# Patient Record
Sex: Male | Born: 1950 | Race: White | Hispanic: No | Marital: Married | Smoking: Former smoker
Health system: Southern US, Community
[De-identification: ages and names within clinical notes are randomized; demographics above are authoritative.]

## PROBLEM LIST (undated history)

## (undated) DIAGNOSIS — E119 Type 2 diabetes mellitus without complications: Secondary | ICD-10-CM

## (undated) DIAGNOSIS — E785 Hyperlipidemia, unspecified: Secondary | ICD-10-CM

## (undated) DIAGNOSIS — I517 Cardiomegaly: Secondary | ICD-10-CM

## (undated) DIAGNOSIS — I10 Essential (primary) hypertension: Secondary | ICD-10-CM

## (undated) DIAGNOSIS — I5032 Chronic diastolic (congestive) heart failure: Secondary | ICD-10-CM

## (undated) DIAGNOSIS — I2510 Atherosclerotic heart disease of native coronary artery without angina pectoris: Secondary | ICD-10-CM

## (undated) DIAGNOSIS — I119 Hypertensive heart disease without heart failure: Secondary | ICD-10-CM

## (undated) HISTORY — DX: Hypertensive heart disease without heart failure: I11.9

## (undated) HISTORY — DX: Atherosclerotic heart disease of native coronary artery without angina pectoris: I25.10

## (undated) HISTORY — DX: Hyperlipidemia, unspecified: E78.5

## (undated) HISTORY — DX: Type 2 diabetes mellitus without complications: E11.9

## (undated) HISTORY — DX: Cardiomegaly: I51.7

## (undated) HISTORY — DX: Chronic diastolic (congestive) heart failure: I50.32

---

## 2009-02-05 ENCOUNTER — Inpatient Hospital Stay (HOSPITAL_COMMUNITY): Admission: EM | Admit: 2009-02-05 | Discharge: 2009-02-08 | Payer: Self-pay | Admitting: Interventional Cardiology

## 2009-02-05 ENCOUNTER — Ambulatory Visit: Payer: Self-pay | Admitting: Internal Medicine

## 2009-03-08 ENCOUNTER — Inpatient Hospital Stay (HOSPITAL_COMMUNITY): Admission: AD | Admit: 2009-03-08 | Discharge: 2009-03-09 | Payer: Self-pay | Admitting: Interventional Cardiology

## 2009-03-23 ENCOUNTER — Inpatient Hospital Stay (HOSPITAL_COMMUNITY): Admission: AD | Admit: 2009-03-23 | Discharge: 2009-03-24 | Payer: Self-pay | Admitting: Interventional Cardiology

## 2009-12-06 ENCOUNTER — Ambulatory Visit: Payer: Self-pay | Admitting: Cardiology

## 2010-09-07 LAB — BASIC METABOLIC PANEL
BUN: 10 mg/dL (ref 6–23)
CO2: 28 mEq/L (ref 19–32)
CO2: 24 mEq/L (ref 19–32)
Calcium: 9.1 mg/dL (ref 8.4–10.5)
Chloride: 105 mEq/L (ref 96–112)
Chloride: 104 mEq/L (ref 96–112)
Creatinine, Ser: 0.89 mg/dL (ref 0.4–1.5)
GFR calc Af Amer: >60 mL/min (ref >60)
Sodium: 138 mEq/L (ref 135–145)

## 2010-09-07 LAB — GLUCOSE, CAPILLARY
Glucose-Capillary: 146 mg/dL — ABNORMAL HIGH (ref 70–99)
Glucose-Capillary: 160 mg/dL — ABNORMAL HIGH (ref 70–99)
Glucose-Capillary: 198 mg/dL — ABNORMAL HIGH (ref 70–99)
Glucose-Capillary: 88 mg/dL (ref 70–99)

## 2010-09-07 LAB — CBC
Hemoglobin: 13.9 g/dL (ref 13.0–17.0)
MCHC: 35.4 g/dL (ref 30.0–36.0)
MCHC: 35.3 g/dL (ref 30.0–36.0)
MCV: 91.9 fL (ref 78.0–100.0)
Platelets: 206 10*3/uL (ref 150–400)
RBC: 4.3 MIL/uL (ref 4.22–5.81)
WBC: 5.6 10*3/uL (ref 4.0–10.5)

## 2010-09-08 LAB — BASIC METABOLIC PANEL
CO2: 27 mEq/L (ref 19–32)
Calcium: 8.8 mg/dL (ref 8.4–10.5)
Calcium: 8.9 mg/dL (ref 8.4–10.5)
Creatinine, Ser: 1.04 mg/dL (ref 0.4–1.5)
Creatinine, Ser: 1.13 mg/dL (ref 0.4–1.5)
GFR calc Af Amer: >60 mL/min (ref >60)
GFR calc Af Amer: >60 mL/min (ref >60)
GFR calc non Af Amer: >60 mL/min (ref >60)
GFR calc non Af Amer: >60 mL/min (ref >60)
Glucose, Bld: 191 mg/dL — ABNORMAL HIGH (ref 70–99)
Sodium: 135 mEq/L (ref 135–145)
Sodium: 136 mEq/L (ref 135–145)

## 2010-09-08 LAB — GLUCOSE, CAPILLARY
Glucose-Capillary: 146 mg/dL — ABNORMAL HIGH (ref 70–99)
Glucose-Capillary: 158 mg/dL — ABNORMAL HIGH (ref 70–99)
Glucose-Capillary: 195 mg/dL — ABNORMAL HIGH (ref 70–99)
Glucose-Capillary: 207 mg/dL — ABNORMAL HIGH (ref 70–99)
Glucose-Capillary: 244 mg/dL — ABNORMAL HIGH (ref 70–99)
Glucose-Capillary: 265 mg/dL — ABNORMAL HIGH (ref 70–99)
Glucose-Capillary: 302 mg/dL — ABNORMAL HIGH (ref 70–99)

## 2010-09-08 LAB — CBC
Hemoglobin: 12.7 g/dL — ABNORMAL LOW (ref 13.0–17.0)
Hemoglobin: 13.4 g/dL (ref 13.0–17.0)
MCHC: 35.5 g/dL (ref 30.0–36.0)
MCHC: 35.5 g/dL (ref 30.0–36.0)
MCV: 89.2 fL (ref 78.0–100.0)
Platelets: 195 10*3/uL (ref 150–400)
RBC: 4.2 MIL/uL — ABNORMAL LOW (ref 4.22–5.81)
WBC: 7.2 10*3/uL (ref 4.0–10.5)

## 2010-09-08 LAB — TROPONIN I: Troponin I: <0.01 ng/mL (ref 0.00–0.06)

## 2010-09-08 LAB — LIPID PANEL
Cholesterol: 179 mg/dL (ref 0–200)
LDL Cholesterol: UNDETERMINED mg/dL (ref 0–99)
Triglycerides: 537 mg/dL — ABNORMAL HIGH (ref <150)

## 2010-09-08 LAB — CARDIAC PANEL(CRET KIN+CKTOT+MB+TROPI)
CK, MB: 62.9 ng/mL — ABNORMAL HIGH (ref 0.3–4.0)
Relative Index: 6.7 — ABNORMAL HIGH (ref 0.0–2.5)
Total CK: 610 U/L — ABNORMAL HIGH (ref 7–232)
Troponin I: 41.34 ng/mL (ref 0.00–0.06)

## 2010-09-08 LAB — HEMOGLOBIN A1C
Hgb A1c MFr Bld: 11 % — ABNORMAL HIGH (ref 4.6–6.1)
Mean Plasma Glucose: 269 mg/dL

## 2017-09-01 ENCOUNTER — Other Ambulatory Visit: Payer: Self-pay

## 2017-09-01 ENCOUNTER — Inpatient Hospital Stay (HOSPITAL_COMMUNITY)
Admission: EM | Admit: 2017-09-01 | Discharge: 2017-09-10 | DRG: 233 | Disposition: A | Discharge status: Home / Self Care | Payer: BC Managed Care – PPO | Attending: Cardiothoracic Surgery | Admitting: Cardiothoracic Surgery

## 2017-09-01 ENCOUNTER — Inpatient Hospital Stay: Admit: 2017-09-01 | Payer: Self-pay | Admitting: Cardiology

## 2017-09-01 ENCOUNTER — Emergency Department (HOSPITAL_COMMUNITY): Payer: BC Managed Care – PPO

## 2017-09-01 ENCOUNTER — Encounter (HOSPITAL_COMMUNITY)
Admission: EM | Disposition: A | Discharge status: Home / Self Care | Payer: Self-pay | Source: Home / Self Care | Attending: Cardiothoracic Surgery

## 2017-09-01 ENCOUNTER — Encounter (HOSPITAL_COMMUNITY): Payer: Self-pay | Admitting: *Deleted

## 2017-09-01 DIAGNOSIS — Z7984 Long term (current) use of oral hypoglycemic drugs: Secondary | ICD-10-CM

## 2017-09-01 DIAGNOSIS — E119 Type 2 diabetes mellitus without complications: Secondary | ICD-10-CM | POA: Diagnosis not present

## 2017-09-01 DIAGNOSIS — E1165 Type 2 diabetes mellitus with hyperglycemia: Secondary | ICD-10-CM | POA: Diagnosis present

## 2017-09-01 DIAGNOSIS — Z823 Family history of stroke: Secondary | ICD-10-CM

## 2017-09-01 DIAGNOSIS — I10 Essential (primary) hypertension: Secondary | ICD-10-CM | POA: Diagnosis not present

## 2017-09-01 DIAGNOSIS — Z87891 Personal history of nicotine dependence: Secondary | ICD-10-CM | POA: Diagnosis not present

## 2017-09-01 DIAGNOSIS — I5021 Acute systolic (congestive) heart failure: Secondary | ICD-10-CM | POA: Diagnosis not present

## 2017-09-01 DIAGNOSIS — I110 Hypertensive heart disease with heart failure: Secondary | ICD-10-CM | POA: Diagnosis present

## 2017-09-01 DIAGNOSIS — E785 Hyperlipidemia, unspecified: Secondary | ICD-10-CM | POA: Diagnosis present

## 2017-09-01 DIAGNOSIS — Z7982 Long term (current) use of aspirin: Secondary | ICD-10-CM

## 2017-09-01 DIAGNOSIS — Z951 Presence of aortocoronary bypass graft: Secondary | ICD-10-CM

## 2017-09-01 DIAGNOSIS — I214 Non-ST elevation (NSTEMI) myocardial infarction: Secondary | ICD-10-CM | POA: Diagnosis present

## 2017-09-01 DIAGNOSIS — I2510 Atherosclerotic heart disease of native coronary artery without angina pectoris: Secondary | ICD-10-CM | POA: Diagnosis present

## 2017-09-01 DIAGNOSIS — I252 Old myocardial infarction: Secondary | ICD-10-CM

## 2017-09-01 DIAGNOSIS — D62 Acute posthemorrhagic anemia: Secondary | ICD-10-CM | POA: Diagnosis not present

## 2017-09-01 DIAGNOSIS — Z800 Family history of malignant neoplasm of digestive organs: Secondary | ICD-10-CM | POA: Diagnosis not present

## 2017-09-01 DIAGNOSIS — J9811 Atelectasis: Secondary | ICD-10-CM

## 2017-09-01 DIAGNOSIS — T82855A Stenosis of coronary artery stent, initial encounter: Secondary | ICD-10-CM | POA: Diagnosis present

## 2017-09-01 DIAGNOSIS — Z955 Presence of coronary angioplasty implant and graft: Secondary | ICD-10-CM | POA: Diagnosis not present

## 2017-09-01 DIAGNOSIS — I25110 Atherosclerotic heart disease of native coronary artery with unstable angina pectoris: Secondary | ICD-10-CM

## 2017-09-01 HISTORY — DX: Essential (primary) hypertension: I10

## 2017-09-01 HISTORY — DX: Type 2 diabetes mellitus without complications: E11.9

## 2017-09-01 HISTORY — PX: LEFT HEART CATH AND CORONARY ANGIOGRAPHY: CATH118249

## 2017-09-01 LAB — COMPREHENSIVE METABOLIC PANEL
ALK PHOS: 66 U/L (ref 38–126)
ALT: 31 U/L (ref 17–63)
ANION GAP: 13 (ref 5–15)
AST: 100 U/L — AB (ref 15–41)
Albumin: 3.6 g/dL (ref 3.5–5.0)
BILIRUBIN TOTAL: 1.2 mg/dL (ref 0.3–1.2)
BUN: 15 mg/dL (ref 6–20)
CO2: 20 mmol/L — ABNORMAL LOW (ref 22–32)
Calcium: 8.6 mg/dL — ABNORMAL LOW (ref 8.9–10.3)
Chloride: 104 mmol/L (ref 101–111)
Creatinine, Ser: 0.96 mg/dL (ref 0.61–1.24)
GFR calc Af Amer: >60 mL/min (ref >60)
Glucose, Bld: 156 mg/dL — ABNORMAL HIGH (ref 65–99)
POTASSIUM: 3.9 mmol/L (ref 3.5–5.1)
Sodium: 137 mmol/L (ref 135–145)
TOTAL PROTEIN: 6.9 g/dL (ref 6.5–8.1)

## 2017-09-01 LAB — CBC WITH DIFFERENTIAL/PLATELET
Basophils Absolute: 0.1 10*3/uL (ref 0.0–0.1)
Basophils Relative: 1 %
Eosinophils Absolute: 0.1 10*3/uL (ref 0.0–0.7)
Eosinophils Relative: 1 %
HEMATOCRIT: 46.8 % (ref 39.0–52.0)
HEMOGLOBIN: 16.1 g/dL (ref 13.0–17.0)
LYMPHS PCT: 16 %
Lymphs Abs: 1.9 10*3/uL (ref 0.7–4.0)
MCH: 31.2 pg (ref 26.0–34.0)
MCHC: 34.4 g/dL (ref 30.0–36.0)
MCV: 90.7 fL (ref 78.0–100.0)
MONO ABS: 1.3 10*3/uL — AB (ref 0.1–1.0)
Monocytes Relative: 11 %
NEUTROS ABS: 8.9 10*3/uL — AB (ref 1.7–7.7)
Neutrophils Relative %: 71 %
Platelets: 236 10*3/uL (ref 150–400)
RBC: 5.16 MIL/uL (ref 4.22–5.81)
RDW: 13 % (ref 11.5–15.5)
WBC: 12.3 10*3/uL — ABNORMAL HIGH (ref 4.0–10.5)

## 2017-09-01 LAB — GLUCOSE, CAPILLARY: GLUCOSE-CAPILLARY: 187 mg/dL — AB (ref 65–99)

## 2017-09-01 LAB — TROPONIN I
TROPONIN I: 17.67 ng/mL — AB (ref <0.03)
TROPONIN I: 17.12 ng/mL — AB (ref <0.03)

## 2017-09-01 LAB — I-STAT TROPONIN, ED: TROPONIN I, POC: 20.58 ng/mL — AB (ref 0.00–0.08)

## 2017-09-01 LAB — BRAIN NATRIURETIC PEPTIDE: B NATRIURETIC PEPTIDE 5: 965.6 pg/mL — AB (ref 0.0–100.0)

## 2017-09-01 LAB — MRSA PCR SCREENING: MRSA BY PCR: NEGATIVE

## 2017-09-01 SURGERY — LEFT HEART CATH AND CORONARY ANGIOGRAPHY
Anesthesia: LOCAL

## 2017-09-01 MED ORDER — FUROSEMIDE 10 MG/ML IJ SOLN: 20.0000 mg | Freq: Every day | INTRAMUSCULAR | Status: DC

## 2017-09-01 MED ORDER — HEPARIN BOLUS VIA INFUSION
4000.0000 [IU] | Freq: Once | INTRAVENOUS | Status: AC
  Administered 2017-09-01: 4000 [IU] via INTRAVENOUS
  Filled 2017-09-01: qty 4000

## 2017-09-01 MED ORDER — HEPARIN (PORCINE) IN NACL 2-0.9 UNIT/ML-% IJ SOLN
INTRAMUSCULAR | Status: AC
  Filled 2017-09-01: qty 500

## 2017-09-01 MED ORDER — IOPAMIDOL (ISOVUE-370) INJECTION 76%
INTRAVENOUS | Status: AC
  Filled 2017-09-01: qty 125

## 2017-09-01 MED ORDER — ACETAMINOPHEN 325 MG PO TABS: 650.0000 mg | ORAL_TABLET | ORAL | Status: DC | PRN

## 2017-09-01 MED ORDER — MORPHINE SULFATE (PF) 2 MG/ML IV SOLN: 2.0000 mg | INTRAVENOUS | Status: DC | PRN

## 2017-09-01 MED ORDER — INSULIN ASPART 100 UNIT/ML ~~LOC~~ SOLN
0.0000 [IU] | Freq: Three times a day (TID) | SUBCUTANEOUS | Status: DC
  Administered 2017-09-02: 4 [IU] via SUBCUTANEOUS
  Administered 2017-09-02 – 2017-09-03 (×2): 7 [IU] via SUBCUTANEOUS
  Administered 2017-09-03 – 2017-09-04 (×2): 4 [IU] via SUBCUTANEOUS
  Administered 2017-09-04: 11 [IU] via SUBCUTANEOUS

## 2017-09-01 MED ORDER — MIDAZOLAM HCL 2 MG/2ML IJ SOLN
INTRAMUSCULAR | Status: AC
  Filled 2017-09-01: qty 2

## 2017-09-01 MED ORDER — NITROGLYCERIN 0.4 MG SL SUBL: 0.4000 mg | SUBLINGUAL_TABLET | SUBLINGUAL | Status: DC | PRN

## 2017-09-01 MED ORDER — SODIUM CHLORIDE 0.9% FLUSH
3.0000 mL | Freq: Two times a day (BID) | INTRAVENOUS | Status: DC
  Administered 2017-09-01 – 2017-09-04 (×7): 3 mL via INTRAVENOUS

## 2017-09-01 MED ORDER — HEPARIN SODIUM (PORCINE) 1000 UNIT/ML IJ SOLN
INTRAMUSCULAR | Status: AC
  Filled 2017-09-01: qty 1

## 2017-09-01 MED ORDER — MIDAZOLAM HCL 2 MG/2ML IJ SOLN
INTRAMUSCULAR | Status: DC | PRN
  Administered 2017-09-01: 2 mg via INTRAVENOUS

## 2017-09-01 MED ORDER — ASPIRIN 81 MG PO CHEW
81.0000 mg | CHEWABLE_TABLET | Freq: Every day | ORAL | Status: DC
  Administered 2017-09-01 – 2017-09-04 (×4): 81 mg via ORAL
  Filled 2017-09-01 (×4): qty 1

## 2017-09-01 MED ORDER — SODIUM CHLORIDE 0.9% FLUSH: 3.0000 mL | INTRAVENOUS | Status: DC | PRN

## 2017-09-01 MED ORDER — FENTANYL CITRATE (PF) 100 MCG/2ML IJ SOLN
INTRAMUSCULAR | Status: AC
  Filled 2017-09-01: qty 2

## 2017-09-01 MED ORDER — IOPAMIDOL (ISOVUE-370) INJECTION 76%
INTRAVENOUS | Status: DC | PRN
  Administered 2017-09-01: 90 mL via INTRA_ARTERIAL

## 2017-09-01 MED ORDER — ATORVASTATIN CALCIUM 80 MG PO TABS
80.0000 mg | ORAL_TABLET | Freq: Every day | ORAL | Status: DC
  Administered 2017-09-01 – 2017-09-09 (×8): 80 mg via ORAL
  Filled 2017-09-01 (×8): qty 1

## 2017-09-01 MED ORDER — METOPROLOL TARTRATE 12.5 MG HALF TABLET
12.5000 mg | ORAL_TABLET | Freq: Two times a day (BID) | ORAL | Status: DC
  Administered 2017-09-01 – 2017-09-04 (×7): 12.5 mg via ORAL
  Filled 2017-09-01 (×8): qty 1

## 2017-09-01 MED ORDER — INSULIN DETEMIR 100 UNIT/ML ~~LOC~~ SOLN
10.0000 [IU] | Freq: Every day | SUBCUTANEOUS | Status: DC
  Administered 2017-09-02 – 2017-09-04 (×3): 10 [IU] via SUBCUTANEOUS
  Filled 2017-09-01 (×5): qty 0.1

## 2017-09-01 MED ORDER — SODIUM CHLORIDE 0.9 % IV SOLN: 250.0000 mL | INTRAVENOUS | Status: DC | PRN

## 2017-09-01 MED ORDER — LIDOCAINE HCL (PF) 1 % IJ SOLN
INTRAMUSCULAR | Status: AC
  Filled 2017-09-01: qty 30

## 2017-09-01 MED ORDER — HEPARIN SODIUM (PORCINE) 1000 UNIT/ML IJ SOLN
INTRAMUSCULAR | Status: DC | PRN
  Administered 2017-09-01: 4000 [IU] via INTRAVENOUS

## 2017-09-01 MED ORDER — HEPARIN (PORCINE) IN NACL 100-0.45 UNIT/ML-% IJ SOLN
1500.0000 [IU]/h | INTRAMUSCULAR | Status: DC
  Administered 2017-09-01: 950 [IU]/h via INTRAVENOUS
  Administered 2017-09-02: 1500 [IU]/h via INTRAVENOUS
  Filled 2017-09-01 (×2): qty 250

## 2017-09-01 MED ORDER — ONDANSETRON HCL 4 MG/2ML IJ SOLN: 4.0000 mg | Freq: Four times a day (QID) | INTRAMUSCULAR | Status: DC | PRN

## 2017-09-01 MED ORDER — VERAPAMIL HCL 2.5 MG/ML IV SOLN
INTRAVENOUS | Status: AC
  Filled 2017-09-01: qty 2

## 2017-09-01 MED ORDER — HEPARIN (PORCINE) IN NACL 2-0.9 UNIT/ML-% IJ SOLN
INTRAMUSCULAR | Status: DC | PRN
  Administered 2017-09-01: 10 mL via INTRA_ARTERIAL

## 2017-09-01 MED ORDER — HEPARIN (PORCINE) IN NACL 2-0.9 UNIT/ML-% IJ SOLN
INTRAMUSCULAR | Status: AC | PRN
  Administered 2017-09-01 (×2): 500 mL

## 2017-09-01 MED ORDER — FENTANYL CITRATE (PF) 100 MCG/2ML IJ SOLN
INTRAMUSCULAR | Status: DC | PRN
  Administered 2017-09-01: 25 ug via INTRAVENOUS

## 2017-09-01 MED ORDER — HEPARIN (PORCINE) IN NACL 100-0.45 UNIT/ML-% IJ SOLN
950.0000 [IU]/h | INTRAMUSCULAR | Status: DC
  Administered 2017-09-01: 950 [IU]/h via INTRAVENOUS
  Filled 2017-09-01: qty 250

## 2017-09-01 MED ORDER — LIDOCAINE HCL (PF) 1 % IJ SOLN
INTRAMUSCULAR | Status: DC | PRN
  Administered 2017-09-01: 2 mL

## 2017-09-01 SURGICAL SUPPLY — 14 items
BAND ZEPHYR COMPRESS 30 LONG (HEMOSTASIS) ×2 IMPLANT
CATH IMPULSE 5F ANG/FL3.5 (CATHETERS) ×2 IMPLANT
ELECT DEFIB PAD ADLT CADENCE (PAD) ×2 IMPLANT
GUIDEWIRE INQWIRE 1.5J.035X260 (WIRE) ×1 IMPLANT
INQWIRE 1.5J .035X260CM (WIRE) ×2
KIT ENCORE 26 ADVANTAGE (KITS) ×2 IMPLANT
KIT HEART LEFT (KITS) ×2 IMPLANT
KIT HEMO VALVE WATCHDOG (MISCELLANEOUS) ×2 IMPLANT
NEEDLE PERC 21GX4CM (NEEDLE) ×2 IMPLANT
PACK CARDIAC CATHETERIZATION (CUSTOM PROCEDURE TRAY) ×2 IMPLANT
SHEATH RAIN RADIAL 21G 6FR (SHEATH) ×2 IMPLANT
SYR MEDRAD MARK V 150ML (SYRINGE) ×2 IMPLANT
TRANSDUCER W/STOPCOCK (MISCELLANEOUS) ×2 IMPLANT
TUBING CIL FLEX 10 FLL-RA (TUBING) ×2 IMPLANT

## 2017-09-01 NOTE — ED Triage Notes (Signed)
PT transferred from [facility] With DX End Stemi. . Pt denies any CP on arrival to ED via [facility] transport. PT ambulatory to bed from stretcher.

## 2017-09-01 NOTE — Progress Notes (Signed)
ANTICOAGULATION CONSULT NOTE - Initial Consult  Pharmacy Consult for heparin Indication: chest pain/ACS  No Known Allergies  Patient Measurements: Height: 5\' 7"  (170.2 cm) Weight: 175 lb (79.4 kg) IBW/kg (Calculated) : 66.1 Heparin Dosing Weight: 79.4 kg  Vital Signs: Temp: 98.6 F (37 C) (03/31 1035) Temp Source: Oral (03/31 1035) BP: 141/81 (03/31 1245) Pulse Rate: 69 (03/31 1245)  Labs: Recent Labs    09/01/17 1127  HGB 16.1  HCT 46.8  PLT 236  CREATININE 0.96  TROPONINI 17.67*    Estimated Creatinine Clearance: 76.4 mL/min (by C-G formula based on SCr of 0.96 mg/dL).  Medical History: Past Medical History:  Diagnosis Date  . Coronary artery disease    NSTEMI with significant  2 vessell disease ...bare metal stent to mid circumflex and  residual disease  on RCA ( 80%) diffuse. Normal LV function 02/05/09   . Diabetes mellitus without complication (HCC)   . Hypertension    Assessment: 67 yo M presents with CP. Pharmacy consulted to start heparin. No anticoag PTA. Planned for urgent cath. CBC stable.  Goal of Therapy:  Heparin level 0.3-0.7 units/ml Monitor platelets by anticoagulation protocol: Yes   Plan:  Give heparin 4,000 unit bolus  Start heparin gtt at 950 units/hr Monitor daily heparin level, CBC, s/s of bleed   Elgene Coral J 09/01/2017,1:20 PM

## 2017-09-01 NOTE — Progress Notes (Signed)
Stopped in to follow up with patient and family since praying with them this morning.  Family appreciative of support and would appreciate support as the patient goes to surgery this week.    09/01/17 1620  Clinical Encounter Type  Visited With Patient;Patient and family together  Visit Type Follow-up;Spiritual support

## 2017-09-01 NOTE — H&P (Addendum)
Cardiology Admission History and Physical:   Patient ID: Curtis Little; MRN: [id number]; DOB: 08-Sep-1950   Admission date: 09/01/2017  Primary Care Provider: Patient, No Pcp Per Primary Cardiologist: No primary care provider on file.   Chief Complaint:  Shortness of breath, NSTEMI  Patient Profile:   Curtis Little is a 67 y.o. male with a history of hypertension, diabetes, CAD S/P and NSTEMI with BMS to mid circumflex 2010.   History of Present Illness:   Curtis Little presented to [facility] with shortness of breath.  He has a history of CAD status post NSTEMI in 2010 for which he got a bare-metal stent to the mid circumflex by Dr. Eldridge Dace.   Curtis Little tells me that he awoke around midnight gasping for breath with diaphoresis.  He says he may have had some mild chest pressure and this did not radiate.  He tried to get up and walk around and even walked outside without any relief.  This continued until he was seen in the ED and given oxygen and some type of medication.  His symptoms did resolve but they have been returning intermittently and briefly.  Since his MI in 2010 he has not followed with a cardiologist.  He is followed by his primary care provider and is on aspirin 81 mg statin and beta-blocker.  He is a prior smoker having quit 25 years ago.  He does not drink alcohol regularly.  He has no family history significant for CAD.  Has had no recent exertional chest discomfort or dyspnea.  He says he was outside working in his yard all day yesterday without any exertional symptoms.  Troponin was elevated at 0.75.  EKG without acute ischemic changes. D-dimer was not elevated.  BNP was elevated at 2159.  Chest x-ray showed mild cardiomegaly and mild vascular congestion.  Pneumonia is not excluded.  Blood gas showed low PO2 of 60, O2 sat 93.9.  The patient was given aspirin and started on heparin drip.  He was given metoprolol 25 mg.  Since arrival at [facility] the patient is  noted to have elevated troponins of 17.67 and 20.8.  He also has T wave inversions inferiorly and laterally    Significant findings Troponins 0.75, 17.67, 20.58 BNP 2159, 965.6 CXR: Chronic cardiomegaly, CHF K+ 3.9 Serum creatinine 0.96 AST 100 WBCs 12.3, hemoglobin 16.1 EKG: Sinus rhythm with T wave inversions in leads III and aVF, V5 and V6  Past Medical History:  Diagnosis Date  . Coronary artery disease    NSTEMI with significant  2 vessell disease ...bare metal stent to mid circumflex and  residual disease  on RCA ( 80%) diffuse. Normal LV function 02/05/09   . Diabetes mellitus without complication (HCC)   . Hypertension     History reviewed. No pertinent surgical history.   Medications Prior to Admission: Prior to Admission medications   Medication Sig Start Date End Date Taking? Authorizing Provider  aspirin 325 MG tablet Take 325 mg by mouth daily.   Yes [provider]  atenolol (TENORMIN) 50 MG tablet Take 50 mg by mouth daily.   Yes [provider]  atorvastatin (LIPITOR) 40 MG tablet Take 40 mg by mouth daily. 06/21/17  Yes [provider]  lovastatin (MEVACOR) 20 MG tablet Take 20 mg by mouth at bedtime.   Yes [provider]  TRESIBA FLEXTOUCH 200 UNIT/ML SOPN Inject 70 Units into the skin daily. 07/25/17  Yes [provider]  glipiZIDE (GLUCOTROL)  5 MG tablet Take by mouth 2 (two) times daily before a meal.    [provider]     Allergies:   No Known Allergies  Social History:   Social History   Socioeconomic History  . Marital status: Married    Spouse name: Not on file  . Number of children: Not on file  . Years of education: Not on file  . Highest education level: Not on file  Occupational History  . Not on file  Social Needs  . Financial resource strain: Not on file  . Food insecurity:    Worry: Not on file    Inability: Not on file  . Transportation needs:    Medical: Not on file     Non-medical: Not on file  Tobacco Use  . Smoking status: Former Smoker    Last attempt to quit: 09/02/1998    Years since quitting: 19.0  . Smokeless tobacco: Never Used  Substance and Sexual Activity  . Alcohol use: Yes    Alcohol/week: 0.6 oz    Types: 1 Cans of beer per week  . Drug use: Never  . Sexual activity: Not on file  Lifestyle  . Physical activity:    Days per week: Not on file    Minutes per session: Not on file  . Stress: Not on file  Relationships  . Social connections:    Talks on phone: Not on file    Gets together: Not on file    Attends religious service: Not on file    Active member of club or organization: Not on file    Attends meetings of clubs or organizations: Not on file    Relationship status: Not on file  . Intimate partner violence:    Fear of current or ex partner: Not on file    Emotionally abused: Not on file    Physically abused: Not on file    Forced sexual activity: Not on file  Other Topics Concern  . Not on file  Social History Narrative  . Not on file    Family History:   The patient's family history includes Colon cancer in his mother; Hypertension in his daughter; Other in his sister; Stroke in his father.    ROS:  Please see the history of present illness.  All other ROS reviewed and negative.     Physical Exam/Data:   Vitals:   09/01/17 1200 09/01/17 1215 09/01/17 1230 09/01/17 1245  BP: (!) 141/86 130/83 (!) 141/76 (!) 141/81  Pulse: 76 71 71 69  Resp: (!) 23 (!) 22 (!) 21 20  Temp:      TempSrc:      SpO2: 95% 91% 94% 97%  Weight:      Height:       No intake or output data in the 24 hours ending 09/01/17 1351 Filed Weights   09/01/17 1037  Weight: 175 lb (79.4 kg)   Body mass index is 27.41 kg/m.  General:  Well nourished, well developed, in no acute distress HEENT: normal Lymph: no adenopathy Neck: no JVD Endocrine:  No thryomegaly Vascular: No carotid bruits; FA pulses 2+ bilaterally without bruits    Cardiac:  normal S1, S2; RRR; no murmur  Lungs:  clear to auscultation bilaterally, no wheezing, rhonchi or rales  Abd: soft, nontender, no hepatomegaly  Ext: no edema Musculoskeletal:  No deformities, BUE and BLE strength normal and equal Skin: warm and dry  Neuro:  CNs 2-12 intact, no focal abnormalities  noted Psych:  Normal affect    EKG:  The ECG that was done  was personally reviewed and demonstrates sinus rhythm with T wave inversions in leads III, aVR and V5, V6  Relevant CV Studies:  none  Laboratory Data:  Chemistry Recent Labs  Lab 09/01/17 1127  NA 137  K 3.9  CL 104  CO2 20*  GLUCOSE 156*  BUN 15  CREATININE 0.96  CALCIUM 8.6*  GFRNONAA >60  GFRAA >60  ANIONGAP 13    Recent Labs  Lab 09/01/17 1127  PROT 6.9  ALBUMIN 3.6  AST 100*  ALT 31  ALKPHOS 66  BILITOT 1.2   Hematology Recent Labs  Lab 09/01/17 1127  WBC 12.3*  RBC 5.16  HGB 16.1  HCT 46.8  MCV 90.7  MCH 31.2  MCHC 34.4  RDW 13.0  PLT 236   Cardiac Enzymes Recent Labs  Lab 09/01/17 1127  TROPONINI 17.67*    Recent Labs  Lab 09/01/17 1156  TROPIPOC 20.58*    BNP Recent Labs  Lab 09/01/17 1129  BNP 965.6*    DDimer No results for input(s): DDIMER in the last 168 hours.  Radiology/Studies:  Dg Chest 2 View  Result Date: 09/01/2017 CLINICAL DATA:  Shortness of breath EXAM: CHEST - 2 VIEW COMPARISON:  09/01/2017 FINDINGS: Chronic cardiomegaly. There is diffuse interstitial coarsening with Kerley lines and symmetric perihilar airspace type opacity. Posterior costophrenic sulcus opacification is likely small pleural fluid. Stable aortic and hilar contours. IMPRESSION: CHF. Electronically Signed   By: Marnee Spring M.D.   On: 09/01/2017 12:35    Assessment and Plan:   NSTEMI -Hx NSTEMI w/BMS to mid Cx in 2010 -Transferred from [facility] for elevated troponin. -Troponin now up to 20, inferior lateral EKG changes, and continued intermittent mild dyspnea. -Plan  to take patient to the Cath Lab today to evaluate coronary arteries. -We will check lipid profile and hemoglobin A1c for risk stratification  CAD -No cardiology follow-up since his MI in 2010.  Followed by his PCP.  Medication list includes aspirin 81 mg daily, atenolol 50 mg daily, lovastatin  . Hypertension -Home medications include atenolol 50 mg daily  Diabetes type 2 -On Tresiba at home -Sliding scale insulin while in the hospital -We will check hemoglobin A1c for guidance of risk factor modification   Severity of Illness: The appropriate patient status for this patient is INPATIENT. Inpatient status is judged to be reasonable and necessary in order to provide the required intensity of service to ensure the patient's safety. The patient's presenting symptoms, physical exam findings, and initial radiographic and laboratory data in the context of their chronic comorbidities is felt to place them at high risk for further clinical deterioration. Furthermore, it is not anticipated that the patient will be medically stable for discharge from the hospital within 2 midnights of admission. The following factors support the patient status of inpatient.   " The patient's presenting symptoms include shortness of breath. " The worrisome physical exam findings include elevated troponin and EKG changes. " The initial radiographic and laboratory data are worrisome because of . " The chronic co-morbidities include DM, hypertension, CAD.   * I certify that at the point of admission it is my clinical judgment that the patient will require inpatient hospital care spanning beyond 2 midnights from the point of admission due to high intensity of service, high risk for further deterioration and high frequency of surveillance required.*    For questions or updates, please contact  [facility] Please consult [email or web address] for contact info under Cardiology/STEMI.    Signed, Berton Bon, NP  09/01/2017  1:51 PM   Personally seen and examined. Agree with above.  67 year old male with former inferior myocardial infarction with catheterization performed in 2010 by Dr. Eldridge Dace with diabetes, hypertension who woke up early this morning with symptoms mainly of gasping for breath, heart failure like with some mild chest pressure, diaphoresis.  He felt agitated, walked around.  Remote smoker.  Initial troponin was 0.75 with normal d-dimer.  BNP was elevated at 2100.  PO2 was 60 on arterial gas.  I spoke to the ER physician at [facility] rocking him and ordered transportation over to [facility] for further evaluation.  Once here in the emergency department we repeated blood work and his troponin was 20.  T wave inversions were noted inferiorly and laterally on ECG personally viewed.  Chest x-ray performed there showed potential right basilar lung fullness.  Creatinine 0.96.  Hemoglobin 16.1.  Exam: Anxious, no significant JVD appreciated, no bruits, heart regular rate and rhythm without any significant murmurs rubs or gallops, lungs minimal crackles heard at right base.  No edema.  2+ radial pulses.  Assessment and plan:  Non-ST elevation myocardial infarction -Troponin is now 20.  This places him at increased risk.  He still feeling some shortness of breath off and on.  He may very well require IV Lasix.  We will go ahead and perform urgent cardiac catheterization.  Consider right heart catheterization if felt necessary.  Risks and benefits of procedure including stroke heart attack death renal impairment have been described.  He is willing to proceed.  I have spoken with Dr. Eldridge Dace on phone.   Coronary artery disease status post circumflex stent in 2010 - Going to cardiac catheterization now.  Diabetes with hypertension - Medications reviewed.  Atenolol 50 mg a day.  Evaristo Bury. -Hemoglobin A1c pending.  Hyperlipidemia -He states that he is on a cholesterol medication.  We will check lipid  profile.  Donato Schultz, MD

## 2017-09-01 NOTE — ED Provider Notes (Signed)
[facility] EMERGENCY DEPARTMENT Provider Note   CSN: [id number] Arrival date & time: 09/01/17  1014     History   Chief Complaint Chief Complaint  Patient presents with  . Chest Pain    HPI Curtis Little is a 67 y.o. male.  HPI  Woke up at midnight, felt short of breath, like couldn't get breath Dyspnea present with laying down or sitting up No chest pain No fevers Cough some 5/10 shortness of breath Hx of CAD, no hx of CHF Denies chest pain but does note some very slight chest tightness. Does not feel quite like other MI.   At OSH, BNP 2000, trop .75, ddimer   .3.  He was given aspirin 325mg , heparin 5000mg  bolus and Dr. Anne Fu was contacted.    Past Medical History:  Diagnosis Date  . Coronary artery disease    NSTEMI with significant  2 vessell disease ...bare metal stent to mid circumflex and  residual disease  on RCA ( 80%) diffuse. Normal LV function 02/05/09   . Diabetes mellitus without complication (HCC)   . Hypertension     There are no active problems to display for this patient.   History reviewed. No pertinent surgical history.      Home Medications    Prior to Admission medications   Medication Sig Start Date End Date Taking? Authorizing Provider  aspirin 325 MG tablet Take 325 mg by mouth daily.   Yes [provider]  atenolol (TENORMIN) 50 MG tablet Take 50 mg by mouth daily.   Yes [provider]  atorvastatin (LIPITOR) 40 MG tablet Take 40 mg by mouth daily. 06/21/17  Yes [provider]  lovastatin (MEVACOR) 20 MG tablet Take 20 mg by mouth at bedtime.   Yes [provider]  TRESIBA FLEXTOUCH 200 UNIT/ML SOPN Inject 70 Units into the skin daily. 07/25/17  Yes [provider]  glipiZIDE (GLUCOTROL) 5 MG tablet Take by mouth 2 (two) times daily before a meal.    [provider]    Family History History reviewed. No pertinent family history.  Social History Social  History   Tobacco Use  . Smoking status: Former Smoker    Last attempt to quit: 09/02/1998    Years since quitting: 19.0  . Smokeless tobacco: Never Used  Substance Use Topics  . Alcohol use: Yes    Alcohol/week: 0.6 oz    Types: 1 Cans of beer per week  . Drug use: Never     Allergies   Patient has no known allergies.   Review of Systems Review of Systems  Constitutional: Positive for fatigue. Negative for fever.  HENT: Negative for sore throat.   Eyes: Negative for visual disturbance.  Respiratory: Positive for chest tightness and shortness of breath.   Cardiovascular: Negative for chest pain.  Gastrointestinal: Negative for abdominal pain, nausea and vomiting.  Genitourinary: Negative for difficulty urinating.  Musculoskeletal: Negative for back pain and neck stiffness.  Skin: Negative for rash.  Neurological: Negative for syncope and headaches.     Physical Exam Updated Vital Signs BP (!) 141/81   Pulse 69   Temp 98.6 F (37 C) (Oral)   Resp 20   Ht 5\' 7"  (1.702 m)   Wt 79.4 kg (175 lb)   SpO2 97%   BMI 27.41 kg/m   Physical Exam  Constitutional: He is oriented to person, place, and time. He appears well-developed and well-nourished. No distress.  HENT:  Head: Normocephalic  and atraumatic.  Eyes: Conjunctivae and EOM are normal.  Neck: Normal range of motion.  Cardiovascular: Normal rate, regular rhythm, normal heart sounds and intact distal pulses. Exam reveals no gallop and no friction rub.  No murmur heard. Pulmonary/Chest: Effort normal and breath sounds normal. No respiratory distress. He has no wheezes. He has no rales.  Abdominal: Soft. He exhibits no distension. There is no tenderness. There is no guarding.  Musculoskeletal: He exhibits no edema.  Neurological: He is alert and oriented to person, place, and time.  Skin: Skin is warm and dry. He is not diaphoretic.  Nursing note and vitals reviewed.    ED Treatments / Results  Labs (all labs  ordered are listed, but only abnormal results are displayed) Labs Reviewed  CBC WITH DIFFERENTIAL/PLATELET - Abnormal; Notable for the following components:      Result Value   WBC 12.3 (*)    Neutro Abs 8.9 (*)    Monocytes Absolute 1.3 (*)    All other components within normal limits  COMPREHENSIVE METABOLIC PANEL - Abnormal; Notable for the following components:   CO2 20 (*)    Glucose, Bld 156 (*)    Calcium 8.6 (*)    AST 100 (*)    All other components within normal limits  TROPONIN I - Abnormal; Notable for the following components:   Troponin I 17.67 (*)    All other components within normal limits  BRAIN NATRIURETIC PEPTIDE - Abnormal; Notable for the following components:   B Natriuretic Peptide 965.6 (*)    All other components within normal limits  I-STAT TROPONIN, ED - Abnormal; Notable for the following components:   Troponin i, poc 20.58 (*)    All other components within normal limits  TROPONIN I  TROPONIN I    EKG EKG Interpretation  Date/Time:  Sunday September 01 2017 10:15:53 EDT Ventricular Rate:  71 PR Interval:    QRS Duration: 106 QT Interval:  423 QTC Calculation: 460 R Axis:   81 Text Interpretation:  Sinus rhythm Borderline right axis deviation Repol abnrm suggests ischemia, lateral leads Repolarization abnormality in lateral and inferior leads is new from prior Confirmed by Alvira MondaySchlossman, Courtenay Creger ([id number]) on 09/01/2017 11:20:38 AM   Radiology Dg Chest 2 View  Result Date: 09/01/2017 CLINICAL DATA:  Shortness of breath EXAM: CHEST - 2 VIEW COMPARISON:  09/01/2017 FINDINGS: Chronic cardiomegaly. There is diffuse interstitial coarsening with Kerley lines and symmetric perihilar airspace type opacity. Posterior costophrenic sulcus opacification is likely small pleural fluid. Stable aortic and hilar contours. IMPRESSION: CHF. Electronically Signed   By: Marnee SpringJonathon  Watts M.D.   On: 09/01/2017 12:35    Procedures .Critical Care Performed by: Alvira MondaySchlossman, Special Ranes,  MD Authorized by: Alvira MondaySchlossman, Teaira Croft, MD   Critical care provider statement:    Critical care time (minutes):  30   Critical care was necessary to treat or prevent imminent or life-threatening deterioration of the following conditions:  Cardiac failure   Critical care was time spent personally by me on the following activities:  Discussions with consultants, examination of patient, review of old charts, re-evaluation of patient's condition, ordering and review of radiographic studies, ordering and review of laboratory studies, pulse oximetry and ordering and performing treatments and interventions   (including critical care time)  Medications Ordered in ED Medications  nitroGLYCERIN (NITROSTAT) SL tablet 0.4 mg (has no administration in time range)     Initial Impression / Assessment and Plan / ED Course  I have reviewed the triage vital  signs and the nursing notes.  Pertinent labs & imaging results that were available during my care of the patient were reviewed by me and considered in my medical decision making (see chart for details).     67 year old male with a history of coronary artery disease, MI, diabetes and hypertension presents with concern for shortness of breath, possible N STEMI from [facility].  EKG does show a lateral and inferior T wave changes in comparison to prior.  History unclear if CHF vs ACS.  Patient received heparin bolus and aspirin with EMS.  He initially reported mild chest tightness, but prior to nurse given the nitroglycerin, reported resolution of his symptoms, no chest tightness or pain.  Repeat troponin completed here showing level of 20.  Additional heparin was ordered.  Dr. Anne Fu of cardiology was consulted and notified. Patient to be admitted with concern for NSTEMI.   Final Clinical Impressions(s) / ED Diagnoses   Final diagnoses:  NSTEMI (non-ST elevated myocardial infarction) [facility])    ED Discharge Orders    None       Alvira Monday,  MD 09/01/17 1311

## 2017-09-01 NOTE — Progress Notes (Signed)
Chaplain responding to NSTEMI.  Patient sitting up in bed.  Spouse, daughter and granddaughter at bedside.  Patient states he is [place] and welcomed prayer.  Prayed for the family who are grateful to have spiritual support.  Will continue to follow as needed.    09/01/17 1338  Clinical Encounter Type  Visited With Patient and family together;Health care provider  Visit Type Initial;Spiritual support;ED  Spiritual Encounters  Spiritual Needs Prayer

## 2017-09-01 NOTE — Consult Note (Addendum)
[address]       [place] [address]             [phone]        Curtis Little [facility] Medical Record #[id number] Date of Birth: 09-01-1950  Referring: No ref. provider found Primary Care: Patient, No Pcp Per Primary Cardiologist:No primary care provider on file.  Chief Complaint:    Chief Complaint  Patient presents with  . Chest Pain    History of Present Illness:       Curtis Little is a 67 year old male with a past medical history of coronary artery disease status post NSTEMI in 2010 where he got a bare-metal stent to the mid circumflex and RCA placed by Dr. Eldridge Dace.  He also has hyperlipidemia, hypertension, type 2 diabetes mellitus recently switched to insulin, and he is a reformed smoker having quit 25 years ago.  On September 01, 2017 he presented to the emergency department with shortness of breath, dyspnea with laying down or sitting up, Diaphoresis, and some very slight chest tightness.  The patient tried to walk around but did not get any relief.  His symptoms did eventually resolve but they have been returning intermittently and briefly.  Initial troponin was elevated at 0.75.  His EKG was without acute ischemic changes.  He ultimately underwent a cardiac catheterization which revealed severe three-vessel disease.   We have been consulted for possible surgical revascularization.  Current Activity/ Functional Status: Patient was independent with mobility/ambulation, transfers, ADL's, IADL's.   Zubrod Score: At the time of surgery this patient's most appropriate activity status/level should be described as: []     0    Normal activity, no symptoms [x]     1    Restricted in physical strenuous activity but ambulatory, able to do out light work []     2    Ambulatory and capable of self care, unable to do work activities, up and about                 more than 50%  Of the time                            []     3    Only limited self care, in bed greater  than 50% of waking hours []     4    Completely disabled, no self care, confined to bed or chair []     5    Moribund  Past Medical History:  Diagnosis Date  . Coronary artery disease    NSTEMI with significant  2 vessell disease ...bare metal stent to mid circumflex and  residual disease  on RCA ( 80%) diffuse. Normal LV function 02/05/09   . Diabetes mellitus without complication (HCC)   . Hypertension     History reviewed. No pertinent surgical history.  Social History   Tobacco Use  Smoking Status Former Smoker  . Last attempt to quit: 09/02/1998  . Years since quitting: 19.0  Smokeless Tobacco Never Used    Social History   Substance and Sexual Activity  Alcohol Use Yes  . Alcohol/week: 0.6 oz  . Types: 1 Cans of beer per week    Social History   Socioeconomic History  . Marital status: Married    Spouse name: Not on file  . Number of children: Not on file  . Years of education: Not on file  . Highest  education level: Not on file  Occupational History  . Not on file  Social Needs  . Financial resource strain: Not on file  . Food insecurity:    Worry: Not on file    Inability: Not on file  . Transportation needs:    Medical: Not on file    Non-medical: Not on file  Tobacco Use  . Smoking status: Former Smoker    Last attempt to quit: 09/02/1998    Years since quitting: 19.0  . Smokeless tobacco: Never Used  Substance and Sexual Activity  . Alcohol use: Yes    Alcohol/week: 0.6 oz    Types: 1 Cans of beer per week  . Drug use: Never  . Sexual activity: Not on file  Lifestyle  . Physical activity:    Days per week: Not on file    Minutes per session: Not on file  . Stress: Not on file  Relationships  . Social connections:    Talks on phone: Not on file    Gets together: Not on file    Attends religious service: Not on file    Active member of club or organization: Not on file    Attends meetings of clubs or organizations: Not on file    Relationship  status: Not on file  . Intimate partner violence:    Fear of current or ex partner: Not on file    Emotionally abused: Not on file    Physically abused: Not on file    Forced sexual activity: Not on file  Other Topics Concern  . Not on file  Social History Narrative  . Not on file    No Known Allergies  Current Facility-Administered Medications  Medication Dose Route Frequency Provider Last Rate Last Dose  . 0.9 %  sodium chloride infusion  250 mL Intravenous PRN Corky Crafts, MD      . acetaminophen (TYLENOL) tablet 650 mg  650 mg Oral Q4H PRN Corky Crafts, MD      . aspirin chewable tablet 81 mg  81 mg Oral Daily Corky Crafts, MD      . atorvastatin (LIPITOR) tablet 80 mg  80 mg Oral q1800 Corky Crafts, MD      . heparin ADULT infusion 100 units/mL (25000 units/245mL sodium chloride 0.45%)  950 Units/hr Intravenous Continuous Corky Crafts, MD      . metoprolol tartrate (LOPRESSOR) tablet 12.5 mg  12.5 mg Oral BID Corky Crafts, MD      . nitroGLYCERIN (NITROSTAT) SL tablet 0.4 mg  0.4 mg Sublingual Q5 min PRN Corky Crafts, MD      . ondansetron [facility]) injection 4 mg  4 mg Intravenous Q6H PRN Corky Crafts, MD      . sodium chloride flush (NS) 0.9 % injection 3 mL  3 mL Intravenous Q12H Lance Muss S, MD      . sodium chloride flush (NS) 0.9 % injection 3 mL  3 mL Intravenous PRN Corky Crafts, MD        Medications Prior to Admission  Medication Sig Dispense Refill Last Dose  . aspirin 325 MG tablet Take 325 mg by mouth daily.   08/31/2017 at Unknown time  . atenolol (TENORMIN) 50 MG tablet Take 50 mg by mouth daily.   08/31/2017 at 0800  . atorvastatin (LIPITOR) 40 MG tablet Take 40 mg by mouth daily.   08/31/2017 at Unknown time  . lovastatin (MEVACOR) 20 MG tablet Take  20 mg by mouth at bedtime.   08/31/2017 at Unknown time  . TRESIBA FLEXTOUCH 200 UNIT/ML SOPN Inject 70 Units into the skin daily.    09/01/2017 at Unknown time  . glipiZIDE (GLUCOTROL) 5 MG tablet Take by mouth 2 (two) times daily before a meal.       Family History  Problem Relation Age of Onset  . Colon cancer Mother   . Stroke Father   . Other Sister        overdose in her 56's  . Hypertension Daughter      Review of Systems:   Review of Systems  Constitutional: Positive for diaphoresis.  HENT: Negative.   Eyes: Negative.   Respiratory: Positive for shortness of breath.   Cardiovascular: Positive for chest pain.  Gastrointestinal: Negative for nausea and vomiting.  Genitourinary: Negative.   Neurological: Negative.    Pertinent items are noted in HPI.        Physical Exam: BP 128/81   Pulse 79   Temp 98.6 F (37 C) (Oral)   Resp 19   Ht 5\' 7"  (1.702 m)   Wt 175 lb (79.4 kg)   SpO2 92%   BMI 27.41 kg/m    General appearance: alert, cooperative and no distress Resp: clear to auscultation bilaterally Cardio: regular rate and rhythm, S1, S2 normal, no murmur, click, rub or gallop GI: soft, non-tender; bowel sounds normal; no masses,  no organomegaly Extremities: extremities normal, atraumatic, no cyanosis or edema Neurologic: Grossly normal  Diagnostic Studies & Laboratory data:     Recent Radiology Findings:   Dg Chest 2 View  Result Date: 09/01/2017 CLINICAL DATA:  Shortness of breath EXAM: CHEST - 2 VIEW COMPARISON:  09/01/2017 FINDINGS: Chronic cardiomegaly. There is diffuse interstitial coarsening with Kerley lines and symmetric perihilar airspace type opacity. Posterior costophrenic sulcus opacification is likely small pleural fluid. Stable aortic and hilar contours. IMPRESSION: CHF. Electronically Signed   By: Marnee Spring M.D.   On: 09/01/2017 12:35     I have independently reviewed the above radiologic studies.  Recent Lab Findings: Lab Results  Component Value Date   WBC 12.3 (H) 09/01/2017   HGB 16.1 09/01/2017   HCT 46.8 09/01/2017   PLT 236 09/01/2017   GLUCOSE 156  (H) 09/01/2017   CHOL  02/06/2009    179        ATP III CLASSIFICATION:  <200     mg/dL   Desirable  366-440  mg/dL   Borderline High  >=347    mg/dL   High          TRIG 425 (H) 02/06/2009   HDL 23 (L) 02/06/2009   LDLCALC  02/06/2009    UNABLE TO CALCULATE IF TRIGLYCERIDE OVER 400 mg/dL        Total Cholesterol/HDL:CHD Risk Coronary Heart Disease Risk Table                     Men   Women  1/2 Average Risk   3.4   3.3  Average Risk       5.0   4.4  2 X Average Risk   9.6   7.1  3 X Average Risk  23.4   11.0        Use the calculated Patient Ratio above and the CHD Risk Table to determine the patient's CHD Risk.        ATP III CLASSIFICATION (LDL):  <100     mg/dL  Optimal  100-129  mg/dL   Near or Above                    Optimal  130-159  mg/dL   Borderline  [id number]  mg/dL   High  >045>190     mg/dL   Very High   ALT 31 40/98/119103/31/2019   AST 100 (H) 09/01/2017   NA 137 09/01/2017   K 3.9 09/01/2017   CL 104 09/01/2017   CREATININE 0.96 09/01/2017   BUN 15 09/01/2017   CO2 20 (L) 09/01/2017   HGBA1C (H) 02/06/2009    11.0 (NOTE) The ADA recommends the following therapeutic goal for glycemic control related to Hgb A1c measurement: Goal of therapy: <6.5 Hgb A1c  Reference: American Diabetes Association: Clinical Practice Recommendations 2010, Diabetes Care, 2010, 33: (Suppl  1).      Assessment / Plan:   Plan for CABG this week with Dr. Donata ClayVan Trigt. The procedure was explained in detail and the patient and family's questions were answered to their satisfaction.      I  spent 40 minutes counseling the patient face to face.  Jari Favreessa Conte, PA-C  09/01/2017 3:30 PM  Patient examined and coronary angiogram images personally reviewed. Non-STEMI with moderate LV dysfunction Patient would benefit from bypass graft to the LAD, circumflex, and RCA. Patient will need to recover from MI and start to resolve increasing troponin level Will check echocardiogram and pre-CABG  Dopplers Chest x-ray appears to be with interstitial edema, patient would benefit from diuresis prior to surgery as well.

## 2017-09-01 NOTE — Progress Notes (Signed)
ANTICOAGULATION CONSULT NOTE - Initial Consult  Pharmacy Consult for heparin Indication: chest pain/ACS  No Known Allergies  Patient Measurements: Height: 5\' 7"  (170.2 cm) Weight: 175 lb (79.4 kg) IBW/kg (Calculated) : 66.1 Heparin Dosing Weight: 79.4 kg  Vital Signs: Temp: 98.6 F (37 C) (03/31 1035) Temp Source: Oral (03/31 1035) BP: 128/81 (03/31 1447) Pulse Rate: 79 (03/31 1447)  Labs: Recent Labs    09/01/17 1127  HGB 16.1  HCT 46.8  PLT 236  CREATININE 0.96  TROPONINI 17.67*    Estimated Creatinine Clearance: 76.4 mL/min (by C-G formula based on SCr of 0.96 mg/dL).  Medical History: Past Medical History:  Diagnosis Date  . Coronary artery disease    NSTEMI with significant  2 vessell disease ...bare metal stent to mid circumflex and  residual disease  on RCA ( 80%) diffuse. Normal LV function 02/05/09   . Diabetes mellitus without complication (HCC)   . Hypertension    Assessment: 67 yo M presents with CP. Pharmacy consulted to start heparin. No anticoagulation PTA. CBC stable.  Patient went for urgent cath.   s/p cath  now---severe 3vessel CAD, plan for CABG consult and to restart heparin drip 6 hr post sheath removal. Sheath  removed at 14:52 per RN. No bleeding or hematoma reported.   Goal of Therapy:  Heparin level 0.3-0.7 units/ml Monitor platelets by anticoagulation protocol: Yes   Plan:  Restart heparin drip at 950 ut/hr at 21:00 tonight F/u 6 hr HL Monitor daily heparin level, CBC, s/s of bleed F/u CABG consult  Curtis Little, RPh Clinical Pharmacist Pager: [phone] 09/01/2017,3:39 PM

## 2017-09-02 ENCOUNTER — Inpatient Hospital Stay (HOSPITAL_COMMUNITY): Payer: BC Managed Care – PPO

## 2017-09-02 ENCOUNTER — Other Ambulatory Visit: Payer: Self-pay | Admitting: *Deleted

## 2017-09-02 ENCOUNTER — Encounter (HOSPITAL_COMMUNITY): Payer: Self-pay | Admitting: Interventional Cardiology

## 2017-09-02 ENCOUNTER — Other Ambulatory Visit: Payer: Self-pay

## 2017-09-02 DIAGNOSIS — I5021 Acute systolic (congestive) heart failure: Secondary | ICD-10-CM

## 2017-09-02 DIAGNOSIS — E785 Hyperlipidemia, unspecified: Secondary | ICD-10-CM | POA: Diagnosis not present

## 2017-09-02 DIAGNOSIS — I2510 Atherosclerotic heart disease of native coronary artery without angina pectoris: Secondary | ICD-10-CM

## 2017-09-02 DIAGNOSIS — I214 Non-ST elevation (NSTEMI) myocardial infarction: Secondary | ICD-10-CM

## 2017-09-02 DIAGNOSIS — I25110 Atherosclerotic heart disease of native coronary artery with unstable angina pectoris: Secondary | ICD-10-CM | POA: Diagnosis not present

## 2017-09-02 DIAGNOSIS — Z01810 Encounter for preprocedural cardiovascular examination: Secondary | ICD-10-CM

## 2017-09-02 LAB — LIPID PANEL
CHOLESTEROL: 167 mg/dL (ref 0–200)
Cholesterol: 167 mg/dL (ref 0–200)
HDL: 32 mg/dL — ABNORMAL LOW (ref >40)
HDL: 34 mg/dL — ABNORMAL LOW (ref >40)
LDL CALC: 100 mg/dL — AB (ref 0–99)
LDL Cholesterol: 105 mg/dL — ABNORMAL HIGH (ref 0–99)
TRIGLYCERIDES: 176 mg/dL — AB (ref <150)
Total CHOL/HDL Ratio: 5.2 RATIO
Total CHOL/HDL Ratio: 4.9 RATIO
Triglycerides: 142 mg/dL (ref <150)
VLDL: 35 mg/dL (ref 0–40)
VLDL: 28 mg/dL (ref 0–40)

## 2017-09-02 LAB — PULMONARY FUNCTION TEST
FEF 25-75 Post: 1.3 L/sec
FEF 25-75 Pre: 1.6 L/sec
FEF2575-%Change-Post: -18 %
FEF2575-%Pred-Post: 54 %
FEF2575-%Pred-Pre: 66 %
FEV1-%Change-Post: -3 %
FEV1-%Pred-Post: 54 %
FEV1-%Pred-Pre: 56 %
FEV1-Post: 1.65 L
FEV1-Pre: 1.71 L
FEV1FVC-%Change-Post: 7 %
FEV1FVC-%Pred-Pre: 105 %
FEV6-%Change-Post: -10 %
FEV6-%Pred-Post: 51 %
FEV6-%Pred-Pre: 56 %
FEV6-Post: 1.97 L
FEV6-Pre: 2.19 L
FEV6FVC-%Pred-Post: 105 %
FEV6FVC-%Pred-Pre: 105 %
FVC-%Change-Post: -10 %
FVC-%Pred-Post: 48 %
FVC-%Pred-Pre: 53 %
FVC-Post: 1.97 L
FVC-Pre: 2.19 L
Post FEV1/FVC ratio: 84 %
Post FEV6/FVC ratio: 100 %
Pre FEV1/FVC ratio: 78 %
Pre FEV6/FVC Ratio: 100 %

## 2017-09-02 LAB — COMPREHENSIVE METABOLIC PANEL
ALT: 27 U/L (ref 17–63)
AST: 68 U/L — ABNORMAL HIGH (ref 15–41)
Albumin: 3.2 g/dL — ABNORMAL LOW (ref 3.5–5.0)
Alkaline Phosphatase: 68 U/L (ref 38–126)
Anion gap: 9 (ref 5–15)
BUN: 14 mg/dL (ref 6–20)
CO2: 22 mmol/L (ref 22–32)
Calcium: 8.6 mg/dL — ABNORMAL LOW (ref 8.9–10.3)
Chloride: 107 mmol/L (ref 101–111)
Creatinine, Ser: 0.79 mg/dL (ref 0.61–1.24)
GFR calc Af Amer: >60 mL/min (ref >60)
GFR calc non Af Amer: >60 mL/min (ref >60)
Glucose, Bld: 107 mg/dL — ABNORMAL HIGH (ref 65–99)
Potassium: 3.7 mmol/L (ref 3.5–5.1)
Sodium: 138 mmol/L (ref 135–145)
Total Bilirubin: 1.1 mg/dL (ref 0.3–1.2)
Total Protein: 6.1 g/dL — ABNORMAL LOW (ref 6.5–8.1)

## 2017-09-02 LAB — ECHOCARDIOGRAM COMPLETE
Height: 67 in
Weight: 2800 oz

## 2017-09-02 LAB — BASIC METABOLIC PANEL
ANION GAP: 11 (ref 5–15)
BUN: 16 mg/dL (ref 6–20)
CO2: 21 mmol/L — ABNORMAL LOW (ref 22–32)
Calcium: 8.5 mg/dL — ABNORMAL LOW (ref 8.9–10.3)
Chloride: 105 mmol/L (ref 101–111)
Creatinine, Ser: 0.82 mg/dL (ref 0.61–1.24)
GLUCOSE: 123 mg/dL — AB (ref 65–99)
POTASSIUM: 3.5 mmol/L (ref 3.5–5.1)
Sodium: 137 mmol/L (ref 135–145)

## 2017-09-02 LAB — HEPARIN LEVEL (UNFRACTIONATED)
HEPARIN UNFRACTIONATED: 0.2 [IU]/mL — AB (ref 0.30–0.70)
Heparin Unfractionated: 0.11 IU/mL — ABNORMAL LOW (ref 0.30–0.70)

## 2017-09-02 LAB — CBC
HCT: 44.1 % (ref 39.0–52.0)
Hemoglobin: 14.9 g/dL (ref 13.0–17.0)
MCH: 31 pg (ref 26.0–34.0)
MCHC: 33.8 g/dL (ref 30.0–36.0)
MCV: 91.9 fL (ref 78.0–100.0)
PLATELETS: 233 10*3/uL (ref 150–400)
RBC: 4.8 MIL/uL (ref 4.22–5.81)
RDW: 13.6 % (ref 11.5–15.5)
WBC: 8.3 10*3/uL (ref 4.0–10.5)

## 2017-09-02 LAB — HEMOGLOBIN A1C
Hgb A1c MFr Bld: 9.1 % — ABNORMAL HIGH (ref 4.8–5.6)
Mean Plasma Glucose: 214.47 mg/dL

## 2017-09-02 LAB — URINALYSIS, ROUTINE W REFLEX MICROSCOPIC
Bilirubin Urine: NEGATIVE
Glucose, UA: NEGATIVE mg/dL
Hgb urine dipstick: NEGATIVE
Ketones, ur: NEGATIVE mg/dL
Leukocytes, UA: NEGATIVE
Nitrite: NEGATIVE
Protein, ur: NEGATIVE mg/dL
Specific Gravity, Urine: 1.028 (ref 1.005–1.030)
pH: 5 (ref 5.0–8.0)

## 2017-09-02 LAB — BLOOD GAS, ARTERIAL
Acid-Base Excess: 0.4 mmol/L (ref 0.0–2.0)
Bicarbonate: 23.9 mmol/L (ref 20.0–28.0)
Drawn by: 347621
O2 Content: 2 L/min
O2 Saturation: 95.5 %
Patient temperature: 97.8
pCO2 arterial: 34.1 mmHg (ref 32.0–48.0)
pH, Arterial: 7.458 — ABNORMAL HIGH (ref 7.350–7.450)
pO2, Arterial: 75 mmHg — ABNORMAL LOW (ref 83.0–108.0)

## 2017-09-02 LAB — TROPONIN I
TROPONIN I: 13.73 ng/mL — AB (ref <0.03)
Troponin I: 10.1 ng/mL (ref <0.03)
Troponin I: 6.33 ng/mL (ref <0.03)

## 2017-09-02 LAB — GLUCOSE, CAPILLARY
GLUCOSE-CAPILLARY: 100 mg/dL — AB (ref 65–99)
Glucose-Capillary: 73 mg/dL (ref 65–99)
Glucose-Capillary: 182 mg/dL — ABNORMAL HIGH (ref 65–99)

## 2017-09-02 LAB — TSH: TSH: 2.058 u[IU]/mL (ref 0.350–4.500)

## 2017-09-02 LAB — SURGICAL PCR SCREEN
MRSA, PCR: NEGATIVE
Staphylococcus aureus: NEGATIVE

## 2017-09-02 LAB — PROTIME-INR
INR: 1.09
Prothrombin Time: 14 seconds (ref 11.4–15.2)

## 2017-09-02 MED ORDER — HEPARIN (PORCINE) IN NACL 100-0.45 UNIT/ML-% IJ SOLN
1800.0000 [IU]/h | INTRAMUSCULAR | Status: DC
  Administered 2017-09-03 – 2017-09-04 (×4): 1800 [IU]/h via INTRAVENOUS
  Filled 2017-09-02 (×4): qty 250

## 2017-09-02 MED ORDER — ALBUTEROL SULFATE (2.5 MG/3ML) 0.083% IN NEBU
2.5000 mg | INHALATION_SOLUTION | Freq: Once | RESPIRATORY_TRACT | Status: AC
  Administered 2017-09-02: 2.5 mg via RESPIRATORY_TRACT

## 2017-09-02 MED ORDER — FUROSEMIDE 10 MG/ML IJ SOLN
40.0000 mg | Freq: Every day | INTRAMUSCULAR | Status: AC
  Administered 2017-09-02 – 2017-09-04 (×3): 40 mg via INTRAVENOUS
  Filled 2017-09-02 (×3): qty 4

## 2017-09-02 MED ORDER — FUROSEMIDE 10 MG/ML IJ SOLN: 40.0000 mg | Freq: Two times a day (BID) | INTRAMUSCULAR | Status: DC

## 2017-09-02 NOTE — Progress Notes (Signed)
Pre CABG Dopplers completed. Carotid - 1% to 39% ICA stenosis. Palmar arch normal bilaterally. ABI RT 1.14 Lt  [address]ia Jonica Little, RVS 09/02/2017 7:44 PM

## 2017-09-02 NOTE — Progress Notes (Addendum)
CARDIAC REHAB PHASE I   PRE:  Rate/Rhythm: 68 SR  BP:  Sitting: 134/66     SaO2: 100 % RA  MODE:  Ambulation: 1480 ft   POST:  Rate/Rhythm: 82 Sr  BP:  Sitting: 152/79    SaO2: 97% RA  1447-1540 Pt ambulated 1480 ft with steady gait. No complaints. Preop education completed. Booklet left with patient. Encouraged patient to watch preop video with wife who will be here later. Number left with patient. Bedside RN updated.     Nikki Domverett, Samora Jernberg G, RN 09/02/2017 3:38 PM

## 2017-09-02 NOTE — Progress Notes (Signed)
ABG drawn on 2 lpm for pre-op.

## 2017-09-02 NOTE — Progress Notes (Signed)
Pt complaining of shortness of breath without associated chest pain. O2 saturation 95, vitals stable. Pt repositioned in bed, placed on 3L for comfort and HOB raised > 30 degrees. RN will continue to monitor.

## 2017-09-02 NOTE — Progress Notes (Signed)
ANTICOAGULATION CONSULT NOTE  Pharmacy Consult for heparin Indication: chest pain/ACS  No Known Allergies  Patient Measurements: Height: 5\' 7"  (170.2 cm) Weight: 175 lb (79.4 kg) IBW/kg (Calculated) : 66.1 Heparin Dosing Weight: 79.4 kg  Vital Signs: Temp: 97.8 F (36.6 C) (04/01 0341) Temp Source: Oral (04/01 0341) BP: 133/77 (04/01 0200) Pulse Rate: 66 (04/01 0200)  Labs: Recent Labs    09/01/17 1127 09/01/17 1912 09/02/17 0110 09/02/17 0351  HGB 16.1  --   --  14.9  HCT 46.8  --   --  44.1  PLT 236  --   --  233  LABPROT  --   --   --  14.0  INR  --   --   --  1.09  HEPARINUNFRC  --   --   --  <0.10*  CREATININE 0.96  --  0.82  --   TROPONINI 17.67* 17.12* 13.73*  --     Estimated Creatinine Clearance: 89.5 mL/min (by C-G formula based on SCr of 0.82 mg/dL).  Medical History: Past Medical History:  Diagnosis Date  . Coronary artery disease    NSTEMI with significant  2 vessell disease ...bare metal stent to mid circumflex and  residual disease  on RCA ( 80%) diffuse. Normal LV function 02/05/09   . Diabetes mellitus without complication (HCC)   . Hypertension    Assessment: 67 y.o. male with CAD s/p cath for heparin  Goal of Therapy:  Heparin level 0.3-0.7 units/ml Monitor platelets by anticoagulation protocol: Yes   Plan:  Increase Heparin 1200 units/hr Check heparin level in 8 hours.   Geannie RisenGreg Lively Haberman, PharmD, BCPS  09/02/2017,5:23 AM

## 2017-09-02 NOTE — Progress Notes (Signed)
ANTICOAGULATION CONSULT NOTE - Follow Up Consult  Pharmacy Consult for heparin Indication: chest pain/ACS  No Known Allergies  Patient Measurements: Height: 5\' 7"  (170.2 cm) Weight: 175 lb (79.4 kg) IBW/kg (Calculated) : 66.1 Heparin Dosing Weight: 79.4kg  Vital Signs: Temp: 97.7 F (36.5 C) (04/01 1100) Temp Source: Oral (04/01 1100) BP: 139/79 (04/01 1000) Pulse Rate: 71 (04/01 1200)  Labs: Recent Labs    09/01/17 1127 09/01/17 1912 09/02/17 0110 09/02/17 0351 09/02/17 0805 09/02/17 1207  HGB 16.1  --   --  14.9  --   --   HCT 46.8  --   --  44.1  --   --   PLT 236  --   --  233  --   --   LABPROT  --   --   --  14.0  --   --   INR  --   --   --  1.09  --   --   HEPARINUNFRC  --   --   --  <0.10*  --  0.11*  CREATININE 0.96  --  0.82 0.79  --   --   TROPONINI 17.67* 17.12* 13.73*  --  10.10*  --     Estimated Creatinine Clearance: 91.7 mL/min (by C-G formula based on SCr of 0.79 mg/dL).   Assessment: 67 yo male admitted with CAD. S/P cath with 3 vessel disase. Plan for CABG consult once stable. No anticoag PTA. Hgb 14.9, Plt 233. No bleeding reported, Last heparin level subtherapeutic at 0.11 on 1200u/hr.   Goal of Therapy:  Heparin level 0.3-0.7 units/ml Monitor platelets by anticoagulation protocol: Yes   Plan:  Increase heparin infusion to 1500 units/hr Continue to monitor H&H and platelets  6 hour heparin level   Emeline GeneralWhitney Delyla Sandeen, PharmD Candidate 09/02/2017,2:44 PM

## 2017-09-02 NOTE — Progress Notes (Signed)
ANTICOAGULATION CONSULT NOTE  Pharmacy Consult for heparin Indication: chest pain/ACS  No Known Allergies  Patient Measurements: Height: 5\' 7"  (170.2 cm) Weight: 175 lb (79.4 kg) IBW/kg (Calculated) : 66.1 Heparin Dosing Weight: 79.4 kg  Vital Signs: Temp: 97.7 F (36.5 C) (04/01 2114) Temp Source: Oral (04/01 2114) BP: 130/67 (04/01 2000) Pulse Rate: 77 (04/01 2000)  Labs: Recent Labs    09/01/17 1127  09/02/17 0110 09/02/17 0351 09/02/17 0805 09/02/17 1207 09/02/17 1420 09/02/17 2058  HGB 16.1  --   --  14.9  --   --   --   --   HCT 46.8  --   --  44.1  --   --   --   --   PLT 236  --   --  233  --   --   --   --   LABPROT  --   --   --  14.0  --   --   --   --   INR  --   --   --  1.09  --   --   --   --   HEPARINUNFRC  --   --   --  <0.10*  --  0.11*  --  0.20*  CREATININE 0.96  --  0.82 0.79  --   --   --   --   TROPONINI 17.67*   < > 13.73*  --  10.10*  --  6.33*  --    < > = values in this interval not displayed.    Estimated Creatinine Clearance: 91.7 mL/min (by C-G formula based on SCr of 0.79 mg/dL).  Medical History: Past Medical History:  Diagnosis Date  . Coronary artery disease    NSTEMI with significant  2 vessell disease ...bare metal stent to mid circumflex and  residual disease  on RCA ( 80%) diffuse. Normal LV function 02/05/09   . Diabetes mellitus without complication (HCC)   . Hypertension    Assessment: 67 y.o. male with 3 vessel disease s/p cath 3/31 and plan for CABG on April 4th on IV Heparin.   Heparin level is low at 0.20 on 1500 units/hr.  CBC is stable.  No bleeding reported.  No issues per RN.   Goal of Therapy:  Heparin level 0.3-0.7 units/ml Monitor platelets by anticoagulation protocol: Yes   Plan:  Increase Heparin 1800 units/hr Follow-up AM labs.   Link SnufferJessica Teddy Pena, PharmD, BCPS, BCCCP Clinical Pharmacist Clinical phone 09/02/2017 until 11PM [phone]- [phone] After hours, please call #[phone] 09/02/2017,10:17 PM

## 2017-09-02 NOTE — Progress Notes (Signed)
  Echocardiogram 2D Echocardiogram has been performed.  [name], Curtis Little 09/02/2017, 12:01 PM

## 2017-09-02 NOTE — Progress Notes (Signed)
1 Day Post-Op Procedure(s) (LRB): LEFT HEART CATH AND CORONARY ANGIOGRAPHY (N/A) Subjective: Cardiac enzymes improving Patient's breathing improved Echocardiogram images personally reviewed showing anterior hypokinesia but EF only mildly reduced without valvular insufficiency Plan multivessel CABG first availability on our schedule which is am April 4. Continue diuresis, heparin and cardiac rehab until surgery  Objective: Vital signs in last 24 hours: Temp:  [97.3 F (36.3 C)-98.6 F (37 C)] 98.1 F (36.7 C) (04/01 1500) Pulse Rate:  [62-83] 71 (04/01 1200) Cardiac Rhythm: Normal sinus rhythm (04/01 0800) Resp:  [15-29] 27 (04/01 1600) BP: (125-156)/(62-91) 139/79 (04/01 1000) SpO2:  [90 %-98 %] 96 % (04/01 1500)  Hemodynamic parameters for last 24 hours:   Stable Intake/Output from previous day: 03/31 0701 - 04/01 0700 In: 346.5 [P.O.:240; I.V.:106.5] Out: -  Intake/Output this shift: Total I/O In: 389.4 [P.O.:240; I.V.:149.4] Out: 1400 [Urine:1400]    Lab Results: Recent Labs    09/01/17 1127 09/02/17 0351  WBC 12.3* 8.3  HGB 16.1 14.9  HCT 46.8 44.1  PLT 236 233   BMET:  Recent Labs    09/02/17 0110 09/02/17 0351  NA 137 138  K 3.5 3.7  CL 105 107  CO2 21* 22  GLUCOSE 123* 107*  BUN 16 14  CREATININE 0.82 0.79  CALCIUM 8.5* 8.6*    PT/INR:  Recent Labs    09/02/17 0351  LABPROT 14.0  INR 1.09   ABG    Component Value Date/Time   PHART 7.458 (H) 09/02/2017 0440   HCO3 23.9 09/02/2017 0440   O2SAT 95.5 09/02/2017 0440   CBG (last 3)  Recent Labs    09/01/17 2212 09/02/17 0818 09/02/17 1136  GLUCAP 187* 100* 182*    Assessment/Plan: S/P Procedure(s) (LRB): LEFT HEART CATH AND CORONARY ANGIOGRAPHY (N/A) Multivessel CABG April 4 AM   LOS: 1 day    Kathlee Nationseter Van Trigt III 09/02/2017

## 2017-09-02 NOTE — Progress Notes (Signed)
DAILY PROGRESS NOTE   Patient Name: Curtis Little [facility] Date of Encounter: 09/02/2017  Chief Complaint   No chest pain overnight  Patient Profile   Curtis Little is a 67 y.o. male with a history of hypertension, diabetes, CAD S/P and NSTEMI with BMS to mid circumflex 2010. 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  Subjective   No chest pain overnight, but short of breath. Cath showed multivessel CAD with occluded LCx, severe LAD and diagonal disease and 90% proximal RCA stenosis. LVEF was 35-45% by ventriculogram. Echo is pending. LVEDP was mildly elevated at 19 mmHg. No recorded diuresis overnight. CT surgery has seen and recommends CABG this week.  Objective   Vitals:   09/02/17 0341 09/02/17 0400 09/02/17 0500 09/02/17 0700  BP:  138/77 (!) 156/91 (!) 144/75  Pulse:  69 79 70  Resp:  20 (!) 23 17  Temp: 97.8 F (36.6 C) 97.8 F (36.6 C)  (!) 97.3 F (36.3 C)  TempSrc: Oral Oral  Oral  SpO2:  97% 95% 94%  Weight:      Height:        Intake/Output Summary (Last 24 hours) at 09/02/2017 0945 Last data filed at 09/02/2017 0900 Gross per 24 hour  Intake 390.47 ml  Output -  Net 390.47 ml   Filed Weights   09/01/17 1037  Weight: 175 lb (79.4 kg)    Physical Exam   General appearance: alert and no distress Neck: JVD - 3 cm above sternal notch, no carotid bruit and thyroid not enlarged, symmetric, no tenderness/mass/nodules Lungs: diminished breath sounds bibasilar Heart: regular rate and rhythm, S1, S2 normal, no murmur, click, rub or gallop Abdomen: soft, non-tender; bowel sounds normal; no masses,  no organomegaly Extremities: extremities normal, atraumatic, no cyanosis or edema Pulses: 2+ and symmetric Skin: Skin color, texture, turgor normal. No rashes or lesions Neurologic: Grossly normal Psych: Pleasant  Inpatient Medications    Scheduled Meds: . aspirin  81 mg Oral Daily  . atorvastatin  80 mg Oral q1800  . furosemide  20 mg Intravenous  Daily  . insulin aspart  0-20 Units Subcutaneous TID WC  . insulin detemir  10 Units Subcutaneous Daily  . metoprolol tartrate  12.5 mg Oral BID  . sodium chloride flush  3 mL Intravenous Q12H    Continuous Infusions: . sodium chloride    . heparin 1,200 Units/hr (09/02/17 0700)    PRN Meds: sodium chloride, acetaminophen, morphine injection, nitroGLYCERIN, ondansetron (ZOFRAN) IV, sodium chloride flush   Labs   Results for orders placed or performed during the hospital encounter of 09/01/17 (from the past 48 hour(s))  CBC with Differential     Status: Abnormal   Collection Time: 09/01/17 11:27 AM  Result Value Ref Range   WBC 12.3 (H) 4.0 - 10.5 K/uL   RBC 5.16 4.22 - 5.81 MIL/uL   Hemoglobin 16.1 13.0 - 17.0 g/dL   HCT 46.8 39.0 - 52.0 %   MCV 90.7 78.0 - 100.0 fL   MCH 31.2 26.0 - 34.0 pg   MCHC 34.4 30.0 - 36.0 g/dL   RDW 13.0 11.5 - 15.5 %   Platelets 236 150 - 400 K/uL   Neutrophils Relative % 71 %   Neutro Abs 8.9 (H) 1.7 - 7.7 K/uL   Lymphocytes Relative 16 %   Lymphs Abs 1.9 0.7 - 4.0 K/uL   Monocytes Relative 11 %   Monocytes Absolute 1.3 (H) 0.1 - 1.0 K/uL  Eosinophils Relative 1 %   Eosinophils Absolute 0.1 0.0 - 0.7 K/uL   Basophils Relative 1 %   Basophils Absolute 0.1 0.0 - 0.1 K/uL    Comment: Performed at [facility] [address], [place], [place] [address]  Comprehensive metabolic panel     Status: Abnormal   Collection Time: 09/01/17 11:27 AM  Result Value Ref Range   Sodium 137 135 - 145 mmol/L   Potassium 3.9 3.5 - 5.1 mmol/L   Chloride 104 101 - 111 mmol/L   CO2 20 (L) 22 - 32 mmol/L   Glucose, Bld 156 (H) 65 - 99 mg/dL   BUN 15 6 - 20 mg/dL   Creatinine, Ser 0.96 0.61 - 1.24 mg/dL   Calcium 8.6 (L) 8.9 - 10.3 mg/dL   Total Protein 6.9 6.5 - 8.1 g/dL   Albumin 3.6 3.5 - 5.0 g/dL   AST 100 (H) 15 - 41 U/L   ALT 31 17 - 63 U/L   Alkaline Phosphatase 66 38 - 126 U/L   Total Bilirubin 1.2 0.3 - 1.2 mg/dL   GFR calc non Af Amer >60  >60 mL/min   GFR calc Af Amer >60 >60 mL/min    Comment: (NOTE) The eGFR has been calculated using the CKD EPI equation. This calculation has not been validated in all clinical situations. eGFR's persistently <60 mL/min signify possible Chronic Kidney Disease.    Anion gap 13 5 - 15    Comment: Performed at [facility] [address] [place], [place] [address]  Troponin I (q 6hr x 3)     Status: Abnormal   Collection Time: 09/01/17 11:27 AM  Result Value Ref Range   Troponin I 17.67 (HH) <0.03 ng/mL    Comment: REPEATED TO VERIFY CRITICAL RESULT CALLED TO, READ BACK BY AND VERIFIED WITH: Gordan Payment [id number] 09/01/2017 THOMPSON V RN Performed at [facility], [place] [address], [place], [place] [address]   Brain natriuretic peptide     Status: Abnormal   Collection Time: 09/01/17 11:29 AM  Result Value Ref Range   B Natriuretic Peptide 965.6 (H) 0.0 - 100.0 pg/mL    Comment: Performed at [facility] [address], [place], [place] [address]  I-Stat Troponin, ED (not at [facility])     Status: Abnormal   Collection Time: 09/01/17 11:56 AM  Result Value Ref Range   Troponin i, poc 20.58 (HH) 0.00 - 0.08 ng/mL   Comment NOTIFIED PHYSICIAN    Comment 3            Comment: Due to the release kinetics of cTnI, a negative result within the first hours of the onset of symptoms does not rule out myocardial infarction with certainty. If myocardial infarction is still suspected, repeat the test at appropriate intervals.   MRSA PCR Screening     Status: None   Collection Time: 09/01/17  3:15 PM  Result Value Ref Range   MRSA by PCR NEGATIVE NEGATIVE    Comment:        The GeneXpert MRSA Assay (FDA approved for NASAL specimens only), is one component of a comprehensive MRSA colonization surveillance program. It is not intended to diagnose MRSA infection nor to guide or monitor treatment for MRSA infections. Performed at [facility], [place] [address],  [place], [place] [address]   Glucose, capillary     Status: None   Collection Time: 09/01/17  4:49 PM  Result Value  Ref Range   Glucose-Capillary 73 65 - 99 mg/dL  Troponin I (q 6hr x 3)     Status: Abnormal   Collection Time: 09/01/17  7:12 PM  Result Value Ref Range   Troponin I 17.12 (HH) <0.03 ng/mL    Comment: CRITICAL VALUE NOTED.  VALUE IS CONSISTENT WITH PREVIOUSLY REPORTED AND CALLED VALUE. Performed at [facility], [place] [address], [place], [place] [address]   Glucose, capillary     Status: Abnormal   Collection Time: 09/01/17 10:12 PM  Result Value Ref Range   Glucose-Capillary 187 (H) 65 - 99 mg/dL   Comment 1 Notify RN   Troponin I (q 6hr x 3)     Status: Abnormal   Collection Time: 09/02/17  1:10 AM  Result Value Ref Range   Troponin I 13.73 (HH) <0.03 ng/mL    Comment: CRITICAL VALUE NOTED.  VALUE IS CONSISTENT WITH PREVIOUSLY REPORTED AND CALLED VALUE. Performed at [facility], [place] [address] [place] [address] [place], [place] [address]   Lipid panel     Status: Abnormal   Collection Time: 09/02/17  1:10 AM  Result Value Ref Range   Cholesterol 167 0 - 200 mg/dL   Triglycerides 176 (H) <150 mg/dL   HDL 32 (L) >40 mg/dL   Total CHOL/HDL Ratio 5.2 RATIO   VLDL 35 0 - 40 mg/dL   LDL Cholesterol 100 (H) 0 - 99 mg/dL    Comment:        Total Cholesterol/HDL:CHD Risk Coronary Heart Disease Risk Table                     Men   Women  1/2 Average Risk   3.4   3.3  Average Risk       5.0   4.4  2 X Average Risk   9.6   7.1  3 X Average Risk  23.4   11.0        Use the calculated Patient Ratio above and the CHD Risk Table to determine the patient's CHD Risk.        ATP III CLASSIFICATION (LDL):  <100     mg/dL   Optimal  100-129  mg/dL   Near or Above                    Optimal  130-159  mg/dL   Borderline  160-189  mg/dL   High  >190     mg/dL   Very High Performed at [facility] [address] [place], [place] [address]   Basic metabolic panel      Status: Abnormal   Collection Time: 09/02/17  1:10 AM  Result Value Ref Range   Sodium 137 135 - 145 mmol/L   Potassium 3.5 3.5 - 5.1 mmol/L   Chloride 105 101 - 111 mmol/L   CO2 21 (L) 22 - 32 mmol/L   Glucose, Bld 123 (H) 65 - 99 mg/dL   BUN 16 6 - 20 mg/dL   Creatinine, Ser 0.82 0.61 - 1.24 mg/dL   Calcium 8.5 (L) 8.9 - 10.3 mg/dL   GFR calc non Af Amer >60 >60 mL/min   GFR calc Af Amer >60 >60 mL/min    Comment: (NOTE) The eGFR has been calculated using the CKD EPI equation. This calculation has not been validated in all clinical situations. eGFR's persistently <60 mL/min signify possible Chronic Kidney Disease.    Anion gap 11 5 -  15    Comment: Performed at [facility], [place] [address] [place], [place] [address]  Heparin level (unfractionated)     Status: Abnormal   Collection Time: 09/02/17  3:51 AM  Result Value Ref Range   Heparin Unfractionated <0.10 (L) 0.30 - 0.70 IU/mL    Comment:        IF HEPARIN RESULTS ARE BELOW EXPECTED VALUES, AND PATIENT DOSAGE HAS BEEN CONFIRMED, SUGGEST FOLLOW UP TESTING OF ANTITHROMBIN III LEVELS. Performed at [facility], [place] [address], [place], [place] [address]   TSH     Status: None   Collection Time: 09/02/17  3:51 AM  Result Value Ref Range   TSH 2.058 0.350 - 4.500 uIU/mL    Comment: Performed by a 3rd Generation assay with a functional sensitivity of <=0.01 uIU/mL. Performed at [facility], [place] [address], [place], [place] [address]   Protime-INR     Status: None   Collection Time: 09/02/17  3:51 AM  Result Value Ref Range   Prothrombin Time 14.0 11.4 - 15.2 seconds   INR 1.09     Comment: Performed at [facility] [address], [place], [place] [address]  Lipid panel     Status: Abnormal   Collection Time: 09/02/17  3:51 AM  Result Value Ref Range   Cholesterol 167 0 - 200 mg/dL   Triglycerides 142 <150 mg/dL   HDL 34 (L) >40 mg/dL   Total CHOL/HDL Ratio 4.9 RATIO   VLDL 28 0 - 40  mg/dL   LDL Cholesterol 105 (H) 0 - 99 mg/dL    Comment:        Total Cholesterol/HDL:CHD Risk Coronary Heart Disease Risk Table                     Men   Women  1/2 Average Risk   3.4   3.3  Average Risk       5.0   4.4  2 X Average Risk   9.6   7.1  3 X Average Risk  23.4   11.0        Use the calculated Patient Ratio above and the CHD Risk Table to determine the patient's CHD Risk.        ATP III CLASSIFICATION (LDL):  <100     mg/dL   Optimal  100-129  mg/dL   Near or Above                    Optimal  130-159  mg/dL   Borderline  160-189  mg/dL   High  >190     mg/dL   Very High Performed at [facility] [address], [place], [place] [address]   Hemoglobin A1c     Status: Abnormal   Collection Time: 09/02/17  3:51 AM  Result Value Ref Range   Hgb A1c MFr Bld 9.1 (H) 4.8 - 5.6 %    Comment: (NOTE) Pre diabetes:          5.7%-6.4% Diabetes:              >6.4% Glycemic control for   <7.0% adults with diabetes    Mean Plasma Glucose 214.47 mg/dL    Comment: Performed at [facility] [address] [place], [place] [address]  Comprehensive metabolic panel     Status: Abnormal   Collection Time: 09/02/17  3:51 AM  Result Value Ref  Range   Sodium 138 135 - 145 mmol/L   Potassium 3.7 3.5 - 5.1 mmol/L   Chloride 107 101 - 111 mmol/L   CO2 22 22 - 32 mmol/L   Glucose, Bld 107 (H) 65 - 99 mg/dL   BUN 14 6 - 20 mg/dL   Creatinine, Ser 0.79 0.61 - 1.24 mg/dL   Calcium 8.6 (L) 8.9 - 10.3 mg/dL   Total Protein 6.1 (L) 6.5 - 8.1 g/dL   Albumin 3.2 (L) 3.5 - 5.0 g/dL   AST 68 (H) 15 - 41 U/L   ALT 27 17 - 63 U/L   Alkaline Phosphatase 68 38 - 126 U/L   Total Bilirubin 1.1 0.3 - 1.2 mg/dL   GFR calc non Af Amer >60 >60 mL/min   GFR calc Af Amer >60 >60 mL/min    Comment: (NOTE) The eGFR has been calculated using the CKD EPI equation. This calculation has not been validated in all clinical situations. eGFR's persistently <60 mL/min signify possible Chronic  Kidney Disease.    Anion gap 9 5 - 15    Comment: Performed at [facility] [address] [place] [address]  CBC     Status: None   Collection Time: 09/02/17  3:51 AM  Result Value Ref Range   WBC 8.3 4.0 - 10.5 K/uL   RBC 4.80 4.22 - 5.81 MIL/uL   Hemoglobin 14.9 13.0 - 17.0 g/dL   HCT 44.1 39.0 - 52.0 %   MCV 91.9 78.0 - 100.0 fL   MCH 31.0 26.0 - 34.0 pg   MCHC 33.8 30.0 - 36.0 g/dL   RDW 13.6 11.5 - 15.5 %   Platelets 233 150 - 400 K/uL    Comment: Performed at [facility], [place] [address] [place], [place] [address]  Blood gas, arterial     Status: Abnormal   Collection Time: 09/02/17  4:40 AM  Result Value Ref Range   O2 Content 2.0 L/min   pH, Arterial 7.458 (H) 7.350 - 7.450   pCO2 arterial 34.1 32.0 - 48.0 mmHg   pO2, Arterial 75.0 (L) 83.0 - 108.0 mmHg   Bicarbonate 23.9 20.0 - 28.0 mmol/L   Acid-Base Excess 0.4 0.0 - 2.0 mmol/L   O2 Saturation 95.5 %   Patient temperature 97.8    Collection site LEFT RADIAL    Drawn by [id number]    Sample type ARTERIAL DRAW    Allens test (pass/fail) PASS PASS  Troponin I     Status: Abnormal   Collection Time: 09/02/17  8:05 AM  Result Value Ref Range   Troponin I 10.10 (HH) <0.03 ng/mL    Comment: CRITICAL VALUE NOTED.  VALUE IS CONSISTENT WITH PREVIOUSLY REPORTED AND CALLED VALUE. Performed at [facility], [place] [address] [place], [place] [address]   Glucose, capillary     Status: Abnormal   Collection Time: 09/02/17  8:18 AM  Result Value Ref Range   Glucose-Capillary 100 (H) 65 - 99 mg/dL   Comment 1 Capillary Specimen     ECG   N/A  Telemetry   NSR - Personally Reviewed  Radiology    Dg Chest 2 View  Result Date: 09/01/2017 CLINICAL DATA:  Shortness of breath EXAM: CHEST - 2 VIEW COMPARISON:  09/01/2017 FINDINGS: Chronic cardiomegaly. There is diffuse interstitial coarsening with Kerley lines and symmetric perihilar airspace type opacity. Posterior costophrenic sulcus  opacification is likely small pleural fluid. Stable aortic and hilar contours. IMPRESSION:  CHF. Electronically Signed   By: Monte Fantasia M.D.   On: 09/01/2017 12:35    Cardiac Studies   Echo pending  Assessment   Active Problems:   Non-ST elevation (NSTEMI) myocardial infarction (HCC)   Acute systolic CHF (congestive heart failure) (HCC)   CAD, multiple vessel   Plan   1. Multivessel CAD on cath. Appears mildly volume overloaded on exam - dyspneic at times, when laying down. CXR yesterday consistent with CHF. BNP 956. Start diuresis with lasix. On IV heparin. LDL remains elevated at 105 - atorvastatin increased to 80 mg QHS. Plan to optimize for surgery this week.  Time Spent Directly with Patient:  I have spent a total of 35 minutes with the patient reviewing hospital notes, telemetry, EKGs, labs and examining the patient as well as establishing an assessment and plan that was discussed personally with the patient. > 50% of time was spent in direct patient care.  Length of Stay:  LOS: 1 day   Pixie Casino, MD, [facility], [place] Director of the Advanced Lipid Disorders &  Cardiovascular Risk Reduction Clinic Diplomate of the American Board of Clinical Lipidology Attending Cardiologist  Direct Dial: [phone]  Fax: [phone]  Website:  [email or web address] 09/02/2017, 9:45 AM

## 2017-09-03 ENCOUNTER — Inpatient Hospital Stay (HOSPITAL_COMMUNITY): Payer: BC Managed Care – PPO

## 2017-09-03 DIAGNOSIS — I25110 Atherosclerotic heart disease of native coronary artery with unstable angina pectoris: Secondary | ICD-10-CM | POA: Diagnosis not present

## 2017-09-03 DIAGNOSIS — I2510 Atherosclerotic heart disease of native coronary artery without angina pectoris: Secondary | ICD-10-CM | POA: Diagnosis not present

## 2017-09-03 DIAGNOSIS — G4701 Insomnia due to medical condition: Secondary | ICD-10-CM

## 2017-09-03 DIAGNOSIS — I214 Non-ST elevation (NSTEMI) myocardial infarction: Secondary | ICD-10-CM | POA: Diagnosis not present

## 2017-09-03 DIAGNOSIS — I5021 Acute systolic (congestive) heart failure: Secondary | ICD-10-CM | POA: Diagnosis not present

## 2017-09-03 LAB — CBC
HCT: 41.2 % (ref 39.0–52.0)
Hemoglobin: 13.8 g/dL (ref 13.0–17.0)
MCH: 30.8 pg (ref 26.0–34.0)
MCHC: 33.5 g/dL (ref 30.0–36.0)
MCV: 92 fL (ref 78.0–100.0)
PLATELETS: 207 10*3/uL (ref 150–400)
RBC: 4.48 MIL/uL (ref 4.22–5.81)
RDW: 13.3 % (ref 11.5–15.5)
WBC: 8.3 10*3/uL (ref 4.0–10.5)

## 2017-09-03 LAB — GLUCOSE, CAPILLARY
GLUCOSE-CAPILLARY: 191 mg/dL — AB (ref 65–99)
Glucose-Capillary: 102 mg/dL — ABNORMAL HIGH (ref 65–99)
Glucose-Capillary: 200 mg/dL — ABNORMAL HIGH (ref 65–99)
Glucose-Capillary: 219 mg/dL — ABNORMAL HIGH (ref 65–99)
Glucose-Capillary: 236 mg/dL — ABNORMAL HIGH (ref 65–99)
Glucose-Capillary: 94 mg/dL (ref 65–99)

## 2017-09-03 LAB — HEPARIN LEVEL (UNFRACTIONATED): Heparin Unfractionated: 0.6 IU/mL (ref 0.30–0.70)

## 2017-09-03 MED ORDER — ZOLPIDEM TARTRATE 5 MG PO TABS: 5.0000 mg | ORAL_TABLET | Freq: Every evening | ORAL | Status: DC | PRN

## 2017-09-03 MED ORDER — IOPAMIDOL (ISOVUE-300) INJECTION 61%
INTRAVENOUS | Status: AC
  Administered 2017-09-03: 75 mL via INTRAVENOUS
  Filled 2017-09-03: qty 100

## 2017-09-03 NOTE — Progress Notes (Signed)
DAILY PROGRESS NOTE   Patient Name: Curtis Little [facility] Date of Encounter: 09/03/2017  Chief Complaint   Breathing improved overnight  Patient Profile   Curtis Little is a 67 y.o. male with a history of hypertension, diabetes, CAD S/P and NSTEMI with BMS to mid circumflex 2010. 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  Subjective   Breathing improved overnight. Diuresed about 1L negative. BP remains mildly elevated. Weight 180 lbs. Echo yesterday shows low normal LVEF 50-55%.   Objective   Vitals:   09/03/17 0745 09/03/17 0800 09/03/17 0817 09/03/17 1000  BP:  130/68  (!) 151/83  Pulse: 70 64  81  Resp:      Temp:   97.9 F (36.6 C)   TempSrc:   Oral   SpO2: 97% 96%  96%  Weight:      Height:        Intake/Output Summary (Last 24 hours) at 09/03/2017 1028 Last data filed at 09/03/2017 0700 Gross per 24 hour  Intake 691.15 ml  Output 1575 ml  Net -883.85 ml   Filed Weights   09/01/17 1037 09/03/17 0500  Weight: 175 lb (79.4 kg) 180 lb 9.6 oz (81.9 kg)    Physical Exam   General appearance: alert and no distress Neck: JVD - 1 cm above sternal notch, no carotid bruit and thyroid not enlarged, symmetric, no tenderness/mass/nodules Lungs: diminished breath sounds bibasilar Heart: regular rate and rhythm, S1, S2 normal, no murmur, click, rub or gallop Abdomen: soft, non-tender; bowel sounds normal; no masses,  no organomegaly Extremities: extremities normal, atraumatic, no cyanosis or edema Pulses: 2+ and symmetric Skin: Skin color, texture, turgor normal. No rashes or lesions Neurologic: Grossly normal Psych: Pleasant  Inpatient Medications    Scheduled Meds: . aspirin  81 mg Oral Daily  . atorvastatin  80 mg Oral q1800  . furosemide  40 mg Intravenous Daily  . insulin aspart  0-20 Units Subcutaneous TID WC  . insulin detemir  10 Units Subcutaneous Daily  . metoprolol tartrate  12.5 mg Oral BID  . sodium chloride flush  3 mL Intravenous Q12H     Continuous Infusions: . sodium chloride    . heparin 1,800 Units/hr (09/03/17 0823)    PRN Meds: sodium chloride, acetaminophen, morphine injection, nitroGLYCERIN, ondansetron (ZOFRAN) IV, sodium chloride flush   Labs   Results for orders placed or performed during the hospital encounter of 09/01/17 (from the past 48 hour(s))  CBC with Differential     Status: Abnormal   Collection Time: 09/01/17 11:27 AM  Result Value Ref Range   WBC 12.3 (H) 4.0 - 10.5 K/uL   RBC 5.16 4.22 - 5.81 MIL/uL   Hemoglobin 16.1 13.0 - 17.0 g/dL   HCT 46.8 39.0 - 52.0 %   MCV 90.7 78.0 - 100.0 fL   MCH 31.2 26.0 - 34.0 pg   MCHC 34.4 30.0 - 36.0 g/dL   RDW 13.0 11.5 - 15.5 %   Platelets 236 150 - 400 K/uL   Neutrophils Relative % 71 %   Neutro Abs 8.9 (H) 1.7 - 7.7 K/uL   Lymphocytes Relative 16 %   Lymphs Abs 1.9 0.7 - 4.0 K/uL   Monocytes Relative 11 %   Monocytes Absolute 1.3 (H) 0.1 - 1.0 K/uL   Eosinophils Relative 1 %   Eosinophils Absolute 0.1 0.0 - 0.7 K/uL   Basophils Relative 1 %   Basophils Absolute 0.1 0.0 - 0.1 K/uL  Comment: Performed at [facility], [place] [address], [place], [place] [address]  Comprehensive metabolic panel     Status: Abnormal   Collection Time: 09/01/17 11:27 AM  Result Value Ref Range   Sodium 137 135 - 145 mmol/L   Potassium 3.9 3.5 - 5.1 mmol/L   Chloride 104 101 - 111 mmol/L   CO2 20 (L) 22 - 32 mmol/L   Glucose, Bld 156 (H) 65 - 99 mg/dL   BUN 15 6 - 20 mg/dL   Creatinine, Ser 0.96 0.61 - 1.24 mg/dL   Calcium 8.6 (L) 8.9 - 10.3 mg/dL   Total Protein 6.9 6.5 - 8.1 g/dL   Albumin 3.6 3.5 - 5.0 g/dL   AST 100 (H) 15 - 41 U/L   ALT 31 17 - 63 U/L   Alkaline Phosphatase 66 38 - 126 U/L   Total Bilirubin 1.2 0.3 - 1.2 mg/dL   GFR calc non Af Amer >60 >60 mL/min   GFR calc Af Amer >60 >60 mL/min    Comment: (NOTE) The eGFR has been calculated using the CKD EPI equation. This calculation has not been validated in all clinical  situations. eGFR's persistently <60 mL/min signify possible Chronic Kidney Disease.    Anion gap 13 5 - 15    Comment: Performed at [facility] [address] [place], [place] [address]  Troponin I (q 6hr x 3)     Status: Abnormal   Collection Time: 09/01/17 11:27 AM  Result Value Ref Range   Troponin I 17.67 (HH) <0.03 ng/mL    Comment: REPEATED TO VERIFY CRITICAL RESULT CALLED TO, READ BACK BY AND VERIFIED WITH: Gordan Payment 1244 09/01/2017 THOMPSON V RN Performed at [facility], [place] [address] [place], [place] [address]   Brain natriuretic peptide     Status: Abnormal   Collection Time: 09/01/17 11:29 AM  Result Value Ref Range   B Natriuretic Peptide 965.6 (H) 0.0 - 100.0 pg/mL    Comment: Performed at [facility] [address], [place],  [address]  I-Stat Troponin, ED (not at [facility])     Status: Abnormal   Collection Time: 09/01/17 11:56 AM  Result Value Ref Range   Troponin i, poc 20.58 (HH) 0.00 - 0.08 ng/mL   Comment NOTIFIED PHYSICIAN    Comment 3            Comment: Due to the release kinetics of cTnI, a negative result within the first hours of the onset of symptoms does not rule out myocardial infarction with certainty. If myocardial infarction is still suspected, repeat the test at appropriate intervals.   MRSA PCR Screening     Status: None   Collection Time: 09/01/17  3:15 PM  Result Value Ref Range   MRSA by PCR NEGATIVE NEGATIVE    Comment:        The GeneXpert MRSA Assay (FDA approved for NASAL specimens only), is one component of a comprehensive MRSA colonization surveillance program. It is not intended to diagnose MRSA infection nor to guide or monitor treatment for MRSA infections. Performed at [facility], [place] [address] [place], [place] [address]   Glucose, capillary     Status: None   Collection Time: 09/01/17  4:49 PM  Result Value Ref Range   Glucose-Capillary 73 65 - 99 mg/dL  Troponin I (q 6hr x  3)     Status: Abnormal   Collection Time: 09/01/17  7:12 PM  Result Value Ref Range   Troponin I 17.12 (HH) <0.03 ng/mL    Comment: CRITICAL VALUE NOTED.  VALUE IS CONSISTENT WITH PREVIOUSLY REPORTED AND CALLED VALUE. Performed at [facility], [place] [address] [place], [place] [address]   Glucose, capillary     Status: Abnormal   Collection Time: 09/01/17 10:12 PM  Result Value Ref Range   Glucose-Capillary 187 (H) 65 - 99 mg/dL   Comment 1 Notify RN   Troponin I (q 6hr x 3)     Status: Abnormal   Collection Time: 09/02/17  1:10 AM  Result Value Ref Range   Troponin I 13.73 (HH) <0.03 ng/mL    Comment: CRITICAL VALUE NOTED.  VALUE IS CONSISTENT WITH PREVIOUSLY REPORTED AND CALLED VALUE. Performed at [facility], [place] [address], [place], [place] [address]   Lipid panel     Status: Abnormal   Collection Time: 09/02/17  1:10 AM  Result Value Ref Range   Cholesterol 167 0 - 200 mg/dL   Triglycerides 176 (H) <150 mg/dL   HDL 32 (L) >40 mg/dL   Total CHOL/HDL Ratio 5.2 RATIO   VLDL 35 0 - 40 mg/dL   LDL Cholesterol 100 (H) 0 - 99 mg/dL    Comment:        Total Cholesterol/HDL:CHD Risk Coronary Heart Disease Risk Table                     Men   Women  1/2 Average Risk   3.4   3.3  Average Risk       5.0   4.4  2 X Average Risk   9.6   7.1  3 X Average Risk  23.4   11.0        Use the calculated Patient Ratio above and the CHD Risk Table to determine the patient's CHD Risk.        ATP III CLASSIFICATION (LDL):  <100     mg/dL   Optimal  100-129  mg/dL   Near or Above                    Optimal  130-159  mg/dL   Borderline  160-189  mg/dL   High  >190     mg/dL   Very High Performed at [facility] [address], [place], [place] [address]   Basic metabolic panel     Status: Abnormal   Collection Time: 09/02/17  1:10 AM  Result Value Ref Range   Sodium 137 135 - 145 mmol/L   Potassium 3.5 3.5 - 5.1 mmol/L   Chloride 105 101 - 111 mmol/L   CO2 21  (L) 22 - 32 mmol/L   Glucose, Bld 123 (H) 65 - 99 mg/dL   BUN 16 6 - 20 mg/dL   Creatinine, Ser 0.82 0.61 - 1.24 mg/dL   Calcium 8.5 (L) 8.9 - 10.3 mg/dL   GFR calc non Af Amer >60 >60 mL/min   GFR calc Af Amer >60 >60 mL/min    Comment: (NOTE) The eGFR has been calculated using the CKD EPI equation. This calculation has not been validated in all clinical situations. eGFR's persistently <60 mL/min signify possible Chronic Kidney Disease.    Anion gap 11 5 - 15    Comment: Performed at [facility] [address] [place], [place] [address]  Heparin level (unfractionated)     Status: Abnormal   Collection Time:  09/02/17  3:51 AM  Result Value Ref Range   Heparin Unfractionated <0.10 (L) 0.30 - 0.70 IU/mL    Comment:        IF HEPARIN RESULTS ARE BELOW EXPECTED VALUES, AND PATIENT DOSAGE HAS BEEN CONFIRMED, SUGGEST FOLLOW UP TESTING OF ANTITHROMBIN III LEVELS. Performed at [facility], [place] [address], [place], [place] [address]   TSH     Status: None   Collection Time: 09/02/17  3:51 AM  Result Value Ref Range   TSH 2.058 0.350 - 4.500 uIU/mL    Comment: Performed by a 3rd Generation assay with a functional sensitivity of <=0.01 uIU/mL. Performed at [facility], [place] [address] [place], [place] [address]   Protime-INR     Status: None   Collection Time: 09/02/17  3:51 AM  Result Value Ref Range   Prothrombin Time 14.0 11.4 - 15.2 seconds   INR 1.09     Comment: Performed at [facility] [address] [place], [place] [address]  Lipid panel     Status: Abnormal   Collection Time: 09/02/17  3:51 AM  Result Value Ref Range   Cholesterol 167 0 - 200 mg/dL   Triglycerides 142 <150 mg/dL   HDL 34 (L) >40 mg/dL   Total CHOL/HDL Ratio 4.9 RATIO   VLDL 28 0 - 40 mg/dL   LDL Cholesterol 105 (H) 0 - 99 mg/dL    Comment:        Total Cholesterol/HDL:CHD Risk Coronary Heart Disease Risk Table                     Men   Women  1/2 Average Risk   3.4    3.3  Average Risk       5.0   4.4  2 X Average Risk   9.6   7.1  3 X Average Risk  23.4   11.0        Use the calculated Patient Ratio above and the CHD Risk Table to determine the patient's CHD Risk.        ATP III CLASSIFICATION (LDL):  <100     mg/dL   Optimal  100-129  mg/dL   Near or Above                    Optimal  130-159  mg/dL   Borderline  160-189  mg/dL   High  >190     mg/dL   Very High Performed at [facility] [address], [place], [place] [address]   Hemoglobin A1c     Status: Abnormal   Collection Time: 09/02/17  3:51 AM  Result Value Ref Range   Hgb A1c MFr Bld 9.1 (H) 4.8 - 5.6 %    Comment: (NOTE) Pre diabetes:          5.7%-6.4% Diabetes:              >6.4% Glycemic control for   <7.0% adults with diabetes    Mean Plasma Glucose 214.47 mg/dL    Comment: Performed at [facility] [address] [place], [place] [address]  Comprehensive metabolic panel     Status: Abnormal   Collection Time: 09/02/17  3:51 AM  Result Value Ref Range   Sodium 138 135 - 145 mmol/L   Potassium 3.7 3.5 - 5.1 mmol/L   Chloride 107 101 - 111 mmol/L   CO2 22 22 - 32  mmol/L   Glucose, Bld 107 (H) 65 - 99 mg/dL   BUN 14 6 - 20 mg/dL   Creatinine, Ser 0.79 0.61 - 1.24 mg/dL   Calcium 8.6 (L) 8.9 - 10.3 mg/dL   Total Protein 6.1 (L) 6.5 - 8.1 g/dL   Albumin 3.2 (L) 3.5 - 5.0 g/dL   AST 68 (H) 15 - 41 U/L   ALT 27 17 - 63 U/L   Alkaline Phosphatase 68 38 - 126 U/L   Total Bilirubin 1.1 0.3 - 1.2 mg/dL   GFR calc non Af Amer >60 >60 mL/min   GFR calc Af Amer >60 >60 mL/min    Comment: (NOTE) The eGFR has been calculated using the CKD EPI equation. This calculation has not been validated in all clinical situations. eGFR's persistently <60 mL/min signify possible Chronic Kidney Disease.    Anion gap 9 5 - 15    Comment: Performed at [facility] [address] [place] [address]  CBC     Status: None   Collection Time: 09/02/17  3:51 AM   Result Value Ref Range   WBC 8.3 4.0 - 10.5 K/uL   RBC 4.80 4.22 - 5.81 MIL/uL   Hemoglobin 14.9 13.0 - 17.0 g/dL   HCT 44.1 39.0 - 52.0 %   MCV 91.9 78.0 - 100.0 fL   MCH 31.0 26.0 - 34.0 pg   MCHC 33.8 30.0 - 36.0 g/dL   RDW 13.6 11.5 - 15.5 %   Platelets 233 150 - 400 K/uL    Comment: Performed at [facility], [place] [address] [place], [place] [address]  Blood gas, arterial     Status: Abnormal   Collection Time: 09/02/17  4:40 AM  Result Value Ref Range   O2 Content 2.0 L/min   pH, Arterial 7.458 (H) 7.350 - 7.450   pCO2 arterial 34.1 32.0 - 48.0 mmHg   pO2, Arterial 75.0 (L) 83.0 - 108.0 mmHg   Bicarbonate 23.9 20.0 - 28.0 mmol/L   Acid-Base Excess 0.4 0.0 - 2.0 mmol/L   O2 Saturation 95.5 %   Patient temperature 97.8    Collection site LEFT RADIAL    Drawn by [id number]    Sample type ARTERIAL DRAW    Allens test (pass/fail) PASS PASS  Troponin I     Status: Abnormal   Collection Time: 09/02/17  8:05 AM  Result Value Ref Range   Troponin I 10.10 (HH) <0.03 ng/mL    Comment: CRITICAL VALUE NOTED.  VALUE IS CONSISTENT WITH PREVIOUSLY REPORTED AND CALLED VALUE. Performed at [facility], [place] [address], [place], [place] [address]   Surgical pcr screen     Status: None   Collection Time: 09/02/17  8:07 AM  Result Value Ref Range   MRSA, PCR NEGATIVE NEGATIVE   Staphylococcus aureus NEGATIVE NEGATIVE    Comment: (NOTE) The Xpert SA Assay (FDA approved for NASAL specimens in patients 37 years of age and older), is one component of a comprehensive surveillance program. It is not intended to diagnose infection nor to guide or monitor treatment. Performed at [facility], [place] [address], [place],  [address]   Glucose, capillary     Status: Abnormal   Collection Time: 09/02/17  8:18 AM  Result Value Ref Range   Glucose-Capillary 100 (H) 65 - 99 mg/dL   Comment 1 Capillary Specimen   Urinalysis, Routine w reflex microscopic     Status: None  Collection Time: 09/02/17 10:27 AM  Result Value Ref Range   Color, Urine YELLOW YELLOW   APPearance CLEAR CLEAR   Specific Gravity, Urine 1.028 1.005 - 1.030   pH 5.0 5.0 - 8.0   Glucose, UA NEGATIVE NEGATIVE mg/dL   Hgb urine dipstick NEGATIVE NEGATIVE   Bilirubin Urine NEGATIVE NEGATIVE   Ketones, ur NEGATIVE NEGATIVE mg/dL   Protein, ur NEGATIVE NEGATIVE mg/dL   Nitrite NEGATIVE NEGATIVE   Leukocytes, UA NEGATIVE NEGATIVE    Comment: Performed at [facility] [address] [place], [place] [address]  Glucose, capillary     Status: Abnormal   Collection Time: 09/02/17 11:36 AM  Result Value Ref Range   Glucose-Capillary 182 (H) 65 - 99 mg/dL   Comment 1 Capillary Specimen   Heparin level (unfractionated)     Status: Abnormal   Collection Time: 09/02/17 12:07 PM  Result Value Ref Range   Heparin Unfractionated 0.11 (L) 0.30 - 0.70 IU/mL    Comment:        IF HEPARIN RESULTS ARE BELOW EXPECTED VALUES, AND PATIENT DOSAGE HAS BEEN CONFIRMED, SUGGEST FOLLOW UP TESTING OF ANTITHROMBIN III LEVELS. Performed at [facility], [place] [address], [place], [place] [address]   Troponin I (q 6hr x 3)     Status: Abnormal   Collection Time: 09/02/17  2:20 PM  Result Value Ref Range   Troponin I 6.33 (HH) <0.03 ng/mL    Comment: CRITICAL VALUE NOTED.  VALUE IS CONSISTENT WITH PREVIOUSLY REPORTED AND CALLED VALUE. Performed at [facility], [place] [address] [place], [place] [address]   Glucose, capillary     Status: Abnormal   Collection Time: 09/02/17  4:11 PM  Result Value Ref Range   Glucose-Capillary 219 (H) 65 - 99 mg/dL   Comment 1 Capillary Specimen   Heparin level (unfractionated)     Status: Abnormal   Collection Time: 09/02/17  8:58 PM  Result Value Ref Range   Heparin Unfractionated 0.20 (L) 0.30 - 0.70 IU/mL    Comment:        IF HEPARIN RESULTS ARE BELOW EXPECTED VALUES, AND PATIENT DOSAGE HAS BEEN CONFIRMED, SUGGEST FOLLOW UP TESTING OF  ANTITHROMBIN III LEVELS. Performed at [facility], [place] [address] [place], [place] [address]   Glucose, capillary     Status: None   Collection Time: 09/02/17 11:58 PM  Result Value Ref Range   Glucose-Capillary 94 65 - 99 mg/dL   Comment 1 Capillary Specimen   CBC     Status: None   Collection Time: 09/03/17  2:55 AM  Result Value Ref Range   WBC 8.3 4.0 - 10.5 K/uL   RBC 4.48 4.22 - 5.81 MIL/uL   Hemoglobin 13.8 13.0 - 17.0 g/dL   HCT 41.2 39.0 - 52.0 %   MCV 92.0 78.0 - 100.0 fL   MCH 30.8 26.0 - 34.0 pg   MCHC 33.5 30.0 - 36.0 g/dL   RDW 13.3 11.5 - 15.5 %   Platelets 207 150 - 400 K/uL    Comment: Performed at [facility], [place] [address], [place], [place] [address]  Heparin level (unfractionated)     Status: None   Collection Time: 09/03/17  8:16 AM  Result Value Ref Range   Heparin Unfractionated 0.60 0.30 - 0.70 IU/mL    Comment:        IF HEPARIN RESULTS ARE BELOW EXPECTED VALUES, AND PATIENT DOSAGE HAS BEEN CONFIRMED, SUGGEST  FOLLOW UP TESTING OF ANTITHROMBIN III LEVELS. Performed at [facility], [place] [address] [place], [place] [address]   Glucose, capillary     Status: Abnormal   Collection Time: 09/03/17  8:16 AM  Result Value Ref Range   Glucose-Capillary 102 (H) 65 - 99 mg/dL   Comment 1 Notify RN     ECG   N/A  Telemetry   NSR - Personally Reviewed  Radiology    Dg Chest 2 View  Result Date: 09/01/2017 CLINICAL DATA:  Shortness of breath EXAM: CHEST - 2 VIEW COMPARISON:  09/01/2017 FINDINGS: Chronic cardiomegaly. There is diffuse interstitial coarsening with Kerley lines and symmetric perihilar airspace type opacity. Posterior costophrenic sulcus opacification is likely small pleural fluid. Stable aortic and hilar contours. IMPRESSION: CHF. Electronically Signed   By: Monte Fantasia M.D.   On: 09/01/2017 12:35    Cardiac Studies   LV EF: 50% -    55%  ------------------------------------------------------------------- Indications:      MI - acute 410.91.  ------------------------------------------------------------------- History:   PMH:   Coronary artery disease.  Congestive heart failure.  ------------------------------------------------------------------- Study Conclusions  - Left ventricle: Wall thickness was increased in a pattern of   moderate LVH. Systolic function was normal. The estimated   ejection fraction was in the range of 50% to 55%. Wall motion was   normal; there were no regional wall motion abnormalities. Doppler   parameters are consistent with abnormal left ventricular   relaxation (grade 1 diastolic dysfunction).  Assessment   Active Problems:   Non-ST elevation (NSTEMI) myocardial infarction (HCC)   Acute systolic CHF (congestive heart failure) (HCC)   CAD, multiple vessel   Plan   1. Diuresed about 1L negative - LVEF 50-55% on echo - will continue diuresis today. Having trouble sleeping, will give PRN ambien. Last dose of lasix tomorrow - repeat BMET in am.  Time Spent Directly with Patient:  I have spent a total of 25 minutes with the patient reviewing hospital notes, telemetry, EKGs, labs and examining the patient as well as establishing an assessment and plan that was discussed personally with the patient. > 50% of time was spent in direct patient care.  Length of Stay:  LOS: 2 days   Pixie Casino, MD, [facility], [place] Director of the [facility]  Card[facility] Diplomate of the American Board of Clinical Lipidology Attending Cardiologist  Direct Dial: [phone]  Fax: [phone]  Website:  [email or web address] 09/03/2017, 10:28 AM

## 2017-09-03 NOTE — Plan of Care (Signed)
Patient tolerated ambulation around unit floor x 4 laps well. Pt. Verbalized importance of ambulating often to reduce complications from being chair or bed fast. Patient has adequate appetite. Spoke with patient about anxiety over procedure and fear of pain after, and importance of maintaining open communication with nursing staff so timely interventions can be initiated.

## 2017-09-03 NOTE — Progress Notes (Signed)
2 Days Post-Op Procedure(s) (LRB): LEFT HEART CATH AND CORONARY ANGIOGRAPHY (N/A) Subjective: Recovering from non-STEMI with left main disease Ambulated 150 feet with rehab today CT scan shows improved aeration no structural fibrosis-pulmonary edema improved Plan CABG on April 4 Objective: Vital signs in last 24 hours: Temp:  [97.7 F (36.5 C)-98.9 F (37.2 C)] 98.9 F (37.2 C) (04/02 1635) Pulse Rate:  [58-83] 66 (04/02 1300) Cardiac Rhythm: Normal sinus rhythm (04/02 1600) Resp:  [13-26] 22 (04/02 1700) BP: (124-154)/(62-95) 124/95 (04/02 1700) SpO2:  [92 %-100 %] 95 % (04/02 1300) Weight:  [180 lb 9.6 oz (81.9 kg)] 180 lb 9.6 oz (81.9 kg) (04/02 0500)  Hemodynamic parameters for last 24 hours:    Intake/Output from previous day: 04/01 0701 - 04/02 0700 In: 877.2 [P.O.:360; I.V.:517.2] Out: 1725 [Urine:1725] Intake/Output this shift: Total I/O In: 788 [P.O.:480; I.V.:308] Out: 825 [Urine:825]    Lab Results: Recent Labs    09/02/17 0351 09/03/17 0255  WBC 8.3 8.3  HGB 14.9 13.8  HCT 44.1 41.2  PLT 233 207   BMET:  Recent Labs    09/02/17 0110 09/02/17 0351  NA 137 138  K 3.5 3.7  CL 105 107  CO2 21* 22  GLUCOSE 123* 107*  BUN 16 14  CREATININE 0.82 0.79  CALCIUM 8.5* 8.6*    PT/INR:  Recent Labs    09/02/17 0351  LABPROT 14.0  INR 1.09   ABG    Component Value Date/Time   PHART 7.458 (H) 09/02/2017 0440   HCO3 23.9 09/02/2017 0440   O2SAT 95.5 09/02/2017 0440   CBG (last 3)  Recent Labs    09/03/17 0816 09/03/17 1121 09/03/17 1634  GLUCAP 102* 200* 236*    Assessment/Plan: S/P Procedure(s) (LRB): LEFT HEART CATH AND CORONARY ANGIOGRAPHY (N/A) Severe multivessel CAD with non-STEMI Moderate LV dysfunction without valvular insufficiency Plan multivessel CABG on April 4   LOS: 2 days    Kathlee Nationseter Van Trigt III 09/03/2017

## 2017-09-03 NOTE — Progress Notes (Signed)
CARDIAC REHAB PHASE I   PRE:  Rate/Rhythm: 76 SR  BP:  Supine:   Sitting: 125/70  Standing:    SaO2: 96%RA  MODE:  Ambulation: 1480 ft   POST:  Rate/Rhythm: 92 SR  BP:  Supine:   Sitting: 143/74  Standing:    SaO2: 96%RA 1435-1500 Pt walked 1480 ft with steady gait and no CP. Tolerated well.   Luetta Nuttingharlene Alianys Chacko, RN BSN  09/03/2017 2:56 PM

## 2017-09-03 NOTE — Progress Notes (Signed)
ANTICOAGULATION CONSULT NOTE  Pharmacy Consult for heparin Indication: chest pain/ACS  No Known Allergies  Patient Measurements: Height: 5\' 7"  (170.2 cm) Weight: 180 lb 9.6 oz (81.9 kg) IBW/kg (Calculated) : 66.1 Heparin Dosing Weight: 79.4 kg  Vital Signs: Temp: 97.9 F (36.6 C) (04/02 0817) Temp Source: Oral (04/02 0817) BP: 130/68 (04/02 0800) Pulse Rate: 64 (04/02 0800)  Labs: Recent Labs    09/01/17 1127  09/02/17 0110  09/02/17 0351 09/02/17 0805 09/02/17 1207 09/02/17 1420 09/02/17 2058 09/03/17 0255 09/03/17 0816  HGB 16.1  --   --   --  14.9  --   --   --   --  13.8  --   HCT 46.8  --   --   --  44.1  --   --   --   --  41.2  --   PLT 236  --   --   --  233  --   --   --   --  207  --   LABPROT  --   --   --   --  14.0  --   --   --   --   --   --   INR  --   --   --   --  1.09  --   --   --   --   --   --   HEPARINUNFRC  --   --   --    < > <0.10*  --  0.11*  --  0.20*  --  0.60  CREATININE 0.96  --  0.82  --  0.79  --   --   --   --   --   --   TROPONINI 17.67*   < > 13.73*  --   --  10.10*  --  6.33*  --   --   --    < > = values in this interval not displayed.    Estimated Creatinine Clearance: 93 mL/min (by C-G formula based on SCr of 0.79 mg/dL).  Medical History: Past Medical History:  Diagnosis Date  . Coronary artery disease    NSTEMI with significant  2 vessell disease ...bare metal stent to mid circumflex and  residual disease  on RCA ( 80%) diffuse. Normal LV function 02/05/09   . Diabetes mellitus without complication (HCC)   . Hypertension    Assessment: 67 y.o. male with 3 vessel disease s/p cath 3/31 and plan for CABG on April 4th on IV Heparin.   Heparin level is within goal range reported at 0.60 this am.  CBC is stable.  No bleeding reported.  No issues per RN and no events reported to RN from overnight.    Goal of Therapy:  Heparin level 0.3-0.7 units/ml Monitor platelets by anticoagulation protocol: Yes   Plan:  Continue  Heparin at 1800 units/hr Daily HL and CBC  Monitor for s/sx of bleeding  Blake DivineShannon Devario Bucklew, Pharm.D. PGY1 Pharmacy Resident 09/03/2017 9:44 AM Main Pharmacy: [phone]

## 2017-09-04 DIAGNOSIS — I214 Non-ST elevation (NSTEMI) myocardial infarction: Secondary | ICD-10-CM | POA: Diagnosis not present

## 2017-09-04 DIAGNOSIS — I25110 Atherosclerotic heart disease of native coronary artery with unstable angina pectoris: Secondary | ICD-10-CM | POA: Diagnosis not present

## 2017-09-04 DIAGNOSIS — I2510 Atherosclerotic heart disease of native coronary artery without angina pectoris: Secondary | ICD-10-CM | POA: Diagnosis not present

## 2017-09-04 DIAGNOSIS — I5021 Acute systolic (congestive) heart failure: Secondary | ICD-10-CM | POA: Diagnosis not present

## 2017-09-04 LAB — GLUCOSE, CAPILLARY
GLUCOSE-CAPILLARY: 167 mg/dL — AB (ref 65–99)
GLUCOSE-CAPILLARY: 251 mg/dL — AB (ref 65–99)
GLUCOSE-CAPILLARY: 221 mg/dL — AB (ref 65–99)
Glucose-Capillary: 107 mg/dL — ABNORMAL HIGH (ref 65–99)

## 2017-09-04 LAB — BASIC METABOLIC PANEL
Anion gap: 11 (ref 5–15)
BUN: 12 mg/dL (ref 6–20)
CALCIUM: 8.7 mg/dL — AB (ref 8.9–10.3)
CHLORIDE: 100 mmol/L — AB (ref 101–111)
CO2: 25 mmol/L (ref 22–32)
CREATININE: 0.9 mg/dL (ref 0.61–1.24)
GFR calc Af Amer: >60 mL/min (ref >60)
Glucose, Bld: 147 mg/dL — ABNORMAL HIGH (ref 65–99)
Potassium: 4 mmol/L (ref 3.5–5.1)
SODIUM: 136 mmol/L (ref 135–145)

## 2017-09-04 LAB — CBC
HEMATOCRIT: 41.5 % (ref 39.0–52.0)
Hemoglobin: 14 g/dL (ref 13.0–17.0)
MCH: 31.2 pg (ref 26.0–34.0)
MCHC: 33.7 g/dL (ref 30.0–36.0)
MCV: 92.4 fL (ref 78.0–100.0)
PLATELETS: 223 10*3/uL (ref 150–400)
RBC: 4.49 MIL/uL (ref 4.22–5.81)
RDW: 13.5 % (ref 11.5–15.5)
WBC: 7 10*3/uL (ref 4.0–10.5)

## 2017-09-04 LAB — HEPARIN LEVEL (UNFRACTIONATED): HEPARIN UNFRACTIONATED: 0.51 [IU]/mL (ref 0.30–0.70)

## 2017-09-04 LAB — PREPARE RBC (CROSSMATCH)

## 2017-09-04 LAB — ABO/RH: ABO/RH(D): A POS

## 2017-09-04 MED ORDER — MILRINONE LACTATE IN DEXTROSE 20-5 MG/100ML-% IV SOLN
0.1250 ug/kg/min | INTRAVENOUS | Status: AC
  Administered 2017-09-05: 0.25 ug/kg/min via INTRAVENOUS
  Filled 2017-09-04: qty 100

## 2017-09-04 MED ORDER — TEMAZEPAM 15 MG PO CAPS: 15.0000 mg | ORAL_CAPSULE | Freq: Once | ORAL | Status: DC | PRN

## 2017-09-04 MED ORDER — VANCOMYCIN HCL 10 G IV SOLR
1250.0000 mg | INTRAVENOUS | Status: AC
  Administered 2017-09-05: 1250 mg via INTRAVENOUS
  Filled 2017-09-04: qty 1250

## 2017-09-04 MED ORDER — SODIUM CHLORIDE 0.9 % IV SOLN
30.0000 ug/min | INTRAVENOUS | Status: AC
  Administered 2017-09-05: 5 ug/min via INTRAVENOUS
  Filled 2017-09-04: qty 2

## 2017-09-04 MED ORDER — CHLORHEXIDINE GLUCONATE 4 % EX LIQD
60.0000 mL | Freq: Once | CUTANEOUS | Status: AC
  Administered 2017-09-05: 4 via TOPICAL
  Filled 2017-09-04: qty 60

## 2017-09-04 MED ORDER — METOPROLOL TARTRATE 12.5 MG HALF TABLET
12.5000 mg | ORAL_TABLET | Freq: Once | ORAL | Status: AC
  Administered 2017-09-05: 12.5 mg via ORAL
  Filled 2017-09-04: qty 1

## 2017-09-04 MED ORDER — TRANEXAMIC ACID 1000 MG/10ML IV SOLN
1.5000 mg/kg/h | INTRAVENOUS | Status: AC
  Administered 2017-09-05: 1.5 mg/kg/h via INTRAVENOUS
  Filled 2017-09-04: qty 25

## 2017-09-04 MED ORDER — BISACODYL 5 MG PO TBEC
5.0000 mg | DELAYED_RELEASE_TABLET | Freq: Once | ORAL | Status: AC
  Administered 2017-09-04: 5 mg via ORAL
  Filled 2017-09-04: qty 1

## 2017-09-04 MED ORDER — TRANEXAMIC ACID (OHS) PUMP PRIME SOLUTION
2.0000 mg/kg | INTRAVENOUS | Status: DC
Start: 2017-09-05 — End: 2017-09-05
  Filled 2017-09-04: qty 1.64

## 2017-09-04 MED ORDER — SODIUM CHLORIDE 0.9 % IV SOLN
INTRAVENOUS | Status: DC
  Filled 2017-09-04: qty 30

## 2017-09-04 MED ORDER — SODIUM CHLORIDE 0.9 % IV SOLN
1.5000 g | INTRAVENOUS | Status: AC
  Administered 2017-09-05: 1.5 g via INTRAVENOUS
  Filled 2017-09-04: qty 1.5

## 2017-09-04 MED ORDER — SODIUM CHLORIDE 0.9 % IV SOLN
750.0000 mg | INTRAVENOUS | Status: DC
  Filled 2017-09-04: qty 750

## 2017-09-04 MED ORDER — MAGNESIUM SULFATE 50 % IJ SOLN
40.0000 meq | INTRAMUSCULAR | Status: DC
  Filled 2017-09-04: qty 9.85

## 2017-09-04 MED ORDER — CHLORHEXIDINE GLUCONATE 0.12 % MT SOLN
15.0000 mL | Freq: Once | OROMUCOSAL | Status: AC
  Administered 2017-09-05: 15 mL via OROMUCOSAL
  Filled 2017-09-04: qty 15

## 2017-09-04 MED ORDER — DOPAMINE-DEXTROSE 3.2-5 MG/ML-% IV SOLN
0.0000 ug/kg/min | INTRAVENOUS | Status: AC
  Administered 2017-09-05: 2 ug/kg/min via INTRAVENOUS
  Filled 2017-09-04: qty 250

## 2017-09-04 MED ORDER — POTASSIUM CHLORIDE 2 MEQ/ML IV SOLN
80.0000 meq | INTRAVENOUS | Status: DC
  Filled 2017-09-04: qty 40

## 2017-09-04 MED ORDER — TRANEXAMIC ACID (OHS) BOLUS VIA INFUSION
15.0000 mg/kg | INTRAVENOUS | Status: AC
  Administered 2017-09-05: 1227 mg via INTRAVENOUS
  Filled 2017-09-04: qty 1227

## 2017-09-04 MED ORDER — DEXMEDETOMIDINE HCL IN NACL 400 MCG/100ML IV SOLN
0.1000 ug/kg/h | INTRAVENOUS | Status: AC
  Administered 2017-09-05: .2 ug/kg/h via INTRAVENOUS
  Filled 2017-09-04: qty 100

## 2017-09-04 MED ORDER — DIAZEPAM 2 MG PO TABS
2.0000 mg | ORAL_TABLET | Freq: Once | ORAL | Status: AC
  Administered 2017-09-05: 2 mg via ORAL
  Filled 2017-09-04: qty 1

## 2017-09-04 MED ORDER — NITROGLYCERIN IN D5W 200-5 MCG/ML-% IV SOLN
2.0000 ug/min | INTRAVENOUS | Status: DC
  Filled 2017-09-04: qty 250

## 2017-09-04 MED ORDER — SODIUM CHLORIDE 0.9 % IV SOLN
INTRAVENOUS | Status: AC
  Administered 2017-09-05: 1 [IU]/h via INTRAVENOUS
  Filled 2017-09-04: qty 1

## 2017-09-04 MED ORDER — PLASMA-LYTE 148 IV SOLN
INTRAVENOUS | Status: AC
  Administered 2017-09-05: 500 mL
  Filled 2017-09-04: qty 2.5

## 2017-09-04 MED ORDER — CHLORHEXIDINE GLUCONATE 4 % EX LIQD
60.0000 mL | Freq: Once | CUTANEOUS | Status: AC
  Administered 2017-09-04: 4 via TOPICAL
  Filled 2017-09-04: qty 60

## 2017-09-04 MED ORDER — EPINEPHRINE PF 1 MG/ML IJ SOLN
0.0000 ug/min | INTRAMUSCULAR | Status: DC
  Filled 2017-09-04: qty 4

## 2017-09-04 NOTE — Progress Notes (Signed)
Checking in with patient as he prepares for surgery tomorrow.  Patient has a great attitude and sense of humor.  Will continue to follow and provide support as he states he [place] and appreciates support.    09/04/17 1043  Clinical Encounter Type  Visited With Patient  Visit Type Follow-up;Pre-op

## 2017-09-04 NOTE — Care Management Note (Signed)
Case Management Note Donn PieriniKristi Kyran Whittier RN, BSN Unit 4E-Case Manager-- 2H coverage [phone]  Patient Details  Name: Curtis FrayDanny W Little MRN: [id number] Date of Birth: [birth year]  Subjective/Objective:  Pt admitted with NSTEMI, MVD found- plan for CABG on 09/05/17                  Action/Plan: PTA Pt lived at home with wife, independent- CM to follow post op for transition of care needs.   Expected Discharge Date:  09/09/17               Expected Discharge Plan:  Home/Self Care  In-House Referral:     Discharge planning Services  CM Consult  Post Acute Care Choice:    Choice offered to:     DME Arranged:    DME Agency:     HH Arranged:    HH Agency:     Status of Service:  In process, will continue to follow  If discussed at Long Length of Stay Meetings, dates discussed:    Discharge Disposition:   Additional Comments:  Darrold SpanWebster, Curtis Rog Hall, RN 09/04/2017, 11:37 AM

## 2017-09-04 NOTE — Progress Notes (Signed)
ANTICOAGULATION CONSULT NOTE  Pharmacy Consult for heparin Indication: chest pain/ACS  No Known Allergies  Patient Measurements: Height: 5\' 7"  (170.2 cm) Weight: 180 lb 5.4 oz (81.8 kg) IBW/kg (Calculated) : 66.1 Heparin Dosing Weight: 79.4 kg  Vital Signs: Temp: 98 F (36.7 C) (04/03 0745) Temp Source: Oral (04/03 0745) BP: 135/67 (04/03 0900) Pulse Rate: 64 (04/03 0900)  Labs: Recent Labs    09/02/17 0110 09/02/17 0351 09/02/17 0805  09/02/17 1420 09/02/17 2058 09/03/17 0255 09/03/17 0816 09/04/17 0340  HGB  --  14.9  --   --   --   --  13.8  --  14.0  HCT  --  44.1  --   --   --   --  41.2  --  41.5  PLT  --  233  --   --   --   --  207  --  223  LABPROT  --  14.0  --   --   --   --   --   --   --   INR  --  1.09  --   --   --   --   --   --   --   HEPARINUNFRC  --  <0.10*  --    < >  --  0.20*  --  0.60 0.51  CREATININE 0.82 0.79  --   --   --   --   --   --  0.90  TROPONINI 13.73*  --  10.10*  --  6.33*  --   --   --   --    < > = values in this interval not displayed.    Estimated Creatinine Clearance: 82.7 mL/min (by C-G formula based on SCr of 0.9 mg/dL).  Medical History: Past Medical History:  Diagnosis Date  . Coronary artery disease    NSTEMI with significant  2 vessell disease ...bare metal stent to mid circumflex and  residual disease  on RCA ( 80%) diffuse. Normal LV function 02/05/09   . Diabetes mellitus without complication (HCC)   . Hypertension    Assessment: 67 y.o. male with 3 vessel disease s/p cath 3/31 and plan for CABG on April 4th on IV Heparin.   Heparin level is within goal range reported at 0.51 this am.  CBC is stable.  No bleeding reported.    Goal of Therapy:  Heparin level 0.3-0.7 units/ml Monitor platelets by anticoagulation protocol: Yes   Plan:  Continue Heparin at 1800 units/hr Daily HL and CBC  Monitor for s/sx of bleeding  Sheppard CoilFrank Casy Tavano PharmD., BCPS Clinical Pharmacist 09/04/2017 10:29 AM

## 2017-09-04 NOTE — Progress Notes (Signed)
CARDIAC REHAB PHASE I   PRE:  Rate/Rhythm: 84 SR  BP:  Supine:   Sitting: 135/44  Standing:    SaO2: 96%RA  MODE:  Ambulation: 1480 ft   POST:  Rate/Rhythm: 101 ST  BP:  Supine:   Sitting: 147/79  Standing:    SaO2: 97%RA 0952-1015 Pt walked 1480 ft with steady gait and no CP. Ready for surgery he states. Tolerated well.  Will follow up after surgery,.   Luetta Nuttingharlene Selam Pietsch, RN BSN  09/04/2017 10:11 AM

## 2017-09-04 NOTE — Progress Notes (Signed)
3 Days Post-Op Procedure(s) (LRB): LEFT HEART CATH AND CORONARY ANGIOGRAPHY (N/A) Subjective: Chest CT, PFTs ok  Plan CABG in am Procedure, benefits and risks d/w patient  Objective: Vital signs in last 24 hours: Temp:  [97.6 F (36.4 C)-98.9 F (37.2 C)] 98 F (36.7 C) (04/03 0745) Pulse Rate:  [57-81] 75 (04/03 0800) Cardiac Rhythm: Normal sinus rhythm (04/03 0800) Resp:  [13-29] 16 (04/03 0800) BP: (124-162)/(62-95) 162/74 (04/03 0800) SpO2:  [88 %-98 %] 98 % (04/03 0800) Weight:  [180 lb 5.4 oz (81.8 kg)] 180 lb 5.4 oz (81.8 kg) (04/03 0500)  Hemodynamic parameters for last 24 hours:    Intake/Output from previous day: 04/02 0701 - 04/03 0700 In: 1392 [P.O.:720; I.V.:672] Out: 1775 [Urine:1775] Intake/Output this shift: No intake/output data recorded.       Exam    General- alert and comfortable    Neck- no JVD, no cervical adenopathy palpable, no carotid bruit   Lungs- clear without rales, wheezes   Cor- regular rate and rhythm, no murmur , gallop   Abdomen- soft, non-tender   Extremities - warm, non-tender, minimal edema   Neuro- oriented, appropriate, no focal weakness   Lab Results: Recent Labs    09/03/17 0255 09/04/17 0340  WBC 8.3 7.0  HGB 13.8 14.0  HCT 41.2 41.5  PLT 207 223   BMET:  Recent Labs    09/02/17 0351 09/04/17 0340  NA 138 136  K 3.7 4.0  CL 107 100*  CO2 22 25  GLUCOSE 107* 147*  BUN 14 12  CREATININE 0.79 0.90  CALCIUM 8.6* 8.7*    PT/INR:  Recent Labs    09/02/17 0351  LABPROT 14.0  INR 1.09   ABG    Component Value Date/Time   PHART 7.458 (H) 09/02/2017 0440   HCO3 23.9 09/02/2017 0440   O2SAT 95.5 09/02/2017 0440   CBG (last 3)  Recent Labs    09/03/17 1634 09/03/17 2153 09/04/17 0744  GLUCAP 236* 191* 167*    Assessment/Plan: S/P Procedure(s) (LRB): LEFT HEART CATH AND CORONARY ANGIOGRAPHY (N/A) CABG in am   LOS: 3 days    Kathlee Nationseter Van Trigt III 09/04/2017

## 2017-09-04 NOTE — Progress Notes (Signed)
DAILY PROGRESS NOTE   Patient Name: Curtis Little [facility] Date of Encounter: 09/04/2017  Chief Complaint   Feels well  Patient Profile   Curtis Little is a 67 y.o. male with a history of hypertension, diabetes, CAD S/P and NSTEMI with BMS to mid circumflex 2010. 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  Subjective   Diuresed about 1L negative - appears euvolemic today. Creatinine stable at 0.9. Plan for CABG tomorrow.  Objective   Vitals:   09/04/17 0700 09/04/17 0745 09/04/17 0800 09/04/17 0900  BP: (!) 142/72  (!) 162/74 135/67  Pulse: 63  75 64  Resp: 20  16 (!) 23  Temp:  98 F (36.7 C)    TempSrc:  Oral    SpO2: 95%  98% 94%  Weight:      Height:        Intake/Output Summary (Last 24 hours) at 09/04/2017 1029 Last data filed at 09/04/2017 1000 Gross per 24 hour  Intake 1512 ml  Output 1775 ml  Net -263 ml   Filed Weights   09/01/17 1037 09/03/17 0500 09/04/17 0500  Weight: 175 lb (79.4 kg) 180 lb 9.6 oz (81.9 kg) 180 lb 5.4 oz (81.8 kg)    Physical Exam   General appearance: alert and no distress Neck: no carotid bruit, no JVD and thyroid not enlarged, symmetric, no tenderness/mass/nodules Lungs: clear to auscultation bilaterally Heart: regular rate and rhythm, S1, S2 normal, no murmur, click, rub or gallop Abdomen: soft, non-tender; bowel sounds normal; no masses,  no organomegaly Extremities: extremities normal, atraumatic, no cyanosis or edema Pulses: 2+ and symmetric Skin: Skin color, texture, turgor normal. No rashes or lesions Neurologic: Grossly normal Psych: Pleasant  Inpatient Medications    Scheduled Meds: . aspirin  81 mg Oral Daily  . atorvastatin  80 mg Oral q1800  . chlorhexidine  60 mL Topical Once   And  . [START ON 09/05/2017] chlorhexidine  60 mL Topical Once  . [START ON 09/05/2017] chlorhexidine  15 mL Mouth/Throat Once  . [START ON 09/05/2017] diazepam  2 mg Oral Once  . [START ON 09/05/2017] heparin-papaverine-plasmalyte  irrigation   Irrigation To OR  . insulin aspart  0-20 Units Subcutaneous TID WC  . insulin detemir  10 Units Subcutaneous Daily  . [START ON 09/05/2017] magnesium sulfate  40 mEq Other To OR  . metoprolol tartrate  12.5 mg Oral BID  . [START ON 09/05/2017] metoprolol tartrate  12.5 mg Oral Once  . [START ON 09/05/2017] potassium chloride  80 mEq Other To OR  . sodium chloride flush  3 mL Intravenous Q12H  . [START ON 09/05/2017] tranexamic acid  15 mg/kg Intravenous To OR  . [START ON 09/05/2017] tranexamic acid  2 mg/kg Intracatheter To OR    Continuous Infusions: . sodium chloride 10 mL/hr at 09/03/17 2000  . [START ON 09/05/2017] cefUROXime (ZINACEF)  IV    . [START ON 09/05/2017] cefUROXime (ZINACEF)  IV    . [START ON 09/05/2017] dexmedetomidine    . [START ON 09/05/2017] DOPamine    . [START ON 09/05/2017] epinephrine    . [START ON 09/05/2017] heparin 30,000 units/NS 1000 mL solution for CELLSAVER    . heparin 1,800 Units/hr (09/04/17 0933)  . [START ON 09/05/2017] insulin (NOVOLIN-R) infusion    . [START ON 09/05/2017] milrinone    . [START ON 09/05/2017] nitroGLYCERIN    . [START ON 09/05/2017] phenylephrine 69m/250mL NS (0.030mml) infusion    . [  START ON 09/05/2017] tranexamic acid (CYKLOKAPRON) infusion ([facility])    . [START ON 09/05/2017] vancomycin      PRN Meds: sodium chloride, acetaminophen, morphine injection, nitroGLYCERIN, ondansetron (ZOFRAN) IV, sodium chloride flush, temazepam, zolpidem   Labs   Results for orders placed or performed during the hospital encounter of 09/01/17 (from the past 48 hour(s))  Glucose, capillary     Status: Abnormal   Collection Time: 09/02/17 11:36 AM  Result Value Ref Range   Glucose-Capillary 182 (H) 65 - 99 mg/dL   Comment 1 Capillary Specimen   Heparin level (unfractionated)     Status: Abnormal   Collection Time: 09/02/17 12:07 PM  Result Value Ref Range   Heparin Unfractionated 0.11 (L) 0.30 - 0.70 IU/mL    Comment:        IF HEPARIN RESULTS ARE  BELOW EXPECTED VALUES, AND PATIENT DOSAGE HAS BEEN CONFIRMED, SUGGEST FOLLOW UP TESTING OF ANTITHROMBIN III LEVELS. Performed at [facility], [place] [address] [place], [place] [address]   Troponin I (q 6hr x 3)     Status: Abnormal   Collection Time: 09/02/17  2:20 PM  Result Value Ref Range   Troponin I 6.33 (HH) <0.03 ng/mL    Comment: CRITICAL VALUE NOTED.  VALUE IS CONSISTENT WITH PREVIOUSLY REPORTED AND CALLED VALUE. Performed at [facility], [place] [address], [place], [place] [address]   Glucose, capillary     Status: Abnormal   Collection Time: 09/02/17  4:11 PM  Result Value Ref Range   Glucose-Capillary 219 (H) 65 - 99 mg/dL   Comment 1 Capillary Specimen   Heparin level (unfractionated)     Status: Abnormal   Collection Time: 09/02/17  8:58 PM  Result Value Ref Range   Heparin Unfractionated 0.20 (L) 0.30 - 0.70 IU/mL    Comment:        IF HEPARIN RESULTS ARE BELOW EXPECTED VALUES, AND PATIENT DOSAGE HAS BEEN CONFIRMED, SUGGEST FOLLOW UP TESTING OF ANTITHROMBIN III LEVELS. Performed at [facility], [place] [address], [place], [place] [address]   Glucose, capillary     Status: None   Collection Time: 09/02/17 11:58 PM  Result Value Ref Range   Glucose-Capillary 94 65 - 99 mg/dL   Comment 1 Capillary Specimen   CBC     Status: None   Collection Time: 09/03/17  2:55 AM  Result Value Ref Range   WBC 8.3 4.0 - 10.5 K/uL   RBC 4.48 4.22 - 5.81 MIL/uL   Hemoglobin 13.8 13.0 - 17.0 g/dL   HCT 41.2 39.0 - 52.0 %   MCV 92.0 78.0 - 100.0 fL   MCH 30.8 26.0 - 34.0 pg   MCHC 33.5 30.0 - 36.0 g/dL   RDW 13.3 11.5 - 15.5 %   Platelets 207 150 - 400 K/uL    Comment: Performed at [facility], [place] [address], [place], [place] [address]  Heparin level (unfractionated)     Status: None   Collection Time: 09/03/17  8:16 AM  Result Value Ref Range   Heparin Unfractionated 0.60 0.30 - 0.70 IU/mL    Comment:        IF HEPARIN RESULTS ARE BELOW EXPECTED  VALUES, AND PATIENT DOSAGE HAS BEEN CONFIRMED, SUGGEST FOLLOW UP TESTING OF ANTITHROMBIN III LEVELS. Performed at [facility], [place] [address], [place], [place] [address]   Glucose, capillary     Status: Abnormal   Collection Time: 09/03/17  8:16 AM  Result Value Ref Range   Glucose-Capillary 102 (H) 65 - 99 mg/dL   Comment 1 Notify RN   Glucose, capillary     Status: Abnormal   Collection Time: 09/03/17 11:21 AM  Result Value Ref Range   Glucose-Capillary 200 (H) 65 - 99 mg/dL   Comment 1 Notify RN   Glucose, capillary     Status: Abnormal   Collection Time: 09/03/17  4:34 PM  Result Value Ref Range   Glucose-Capillary 236 (H) 65 - 99 mg/dL   Comment 1 Notify RN   Glucose, capillary     Status: Abnormal   Collection Time: 09/03/17  9:53 PM  Result Value Ref Range   Glucose-Capillary 191 (H) 65 - 99 mg/dL   Comment 1 Notify RN   CBC     Status: None   Collection Time: 09/04/17  3:40 AM  Result Value Ref Range   WBC 7.0 4.0 - 10.5 K/uL   RBC 4.49 4.22 - 5.81 MIL/uL   Hemoglobin 14.0 13.0 - 17.0 g/dL   HCT 41.5 39.0 - 52.0 %   MCV 92.4 78.0 - 100.0 fL   MCH 31.2 26.0 - 34.0 pg   MCHC 33.7 30.0 - 36.0 g/dL   RDW 13.5 11.5 - 15.5 %   Platelets 223 150 - 400 K/uL    Comment: Performed at [facility], [place] [address] [place], [place] [address]  Heparin level (unfractionated)     Status: None   Collection Time: 09/04/17  3:40 AM  Result Value Ref Range   Heparin Unfractionated 0.51 0.30 - 0.70 IU/mL    Comment:        IF HEPARIN RESULTS ARE BELOW EXPECTED VALUES, AND PATIENT DOSAGE HAS BEEN CONFIRMED, SUGGEST FOLLOW UP TESTING OF ANTITHROMBIN III LEVELS. Performed at [facility], [place] [address] [place], [place] [address]   Basic metabolic panel     Status: Abnormal   Collection Time: 09/04/17  3:40 AM  Result Value Ref Range   Sodium 136 135 - 145 mmol/L   Potassium 4.0 3.5 - 5.1 mmol/L   Chloride 100 (L) 101 - 111 mmol/L   CO2 25 22 - 32 mmol/L    Glucose, Bld 147 (H) 65 - 99 mg/dL   BUN 12 6 - 20 mg/dL   Creatinine, Ser 0.90 0.61 - 1.24 mg/dL   Calcium 8.7 (L) 8.9 - 10.3 mg/dL   GFR calc non Af Amer >60 >60 mL/min   GFR calc Af Amer >60 >60 mL/min    Comment: (NOTE) The eGFR has been calculated using the CKD EPI equation. This calculation has not been validated in all clinical situations. eGFR's persistently <60 mL/min signify possible Chronic Kidney Disease.    Anion gap 11 5 - 15    Comment: Performed at [place] [address], [place], [place] [address]  Glucose, capillary     Status: Abnormal   Collection Time: 09/04/17  7:44 AM  Result Value Ref Range   Glucose-Capillary 167 (H) 65 - 99 mg/dL   Comment 1 Notify RN   Type and screen [place]     Status: None (Preliminary result)   Collection Time: 09/04/17  9:27 AM  Result Value Ref Range   ABO/RH(D) PENDING    Antibody Screen PENDING    Sample Expiration      09/07/2017 Performed at [facility], [place] [address], [place],  [address]   Prepare RBC (crossmatch)  Status: None (Preliminary result)   Collection Time: 09/04/17  9:27 AM  Result Value Ref Range   Order Confirmation      ORDER PROCESSED BY BLOOD BANK Performed at [facility], [place] [address], [place], [place] [address]     ECG   N/A  Telemetry   NSR - Personally Reviewed  Radiology    Ct Chest W Contrast  Result Date: 09/03/2017 CLINICAL DATA:  67 year old male with shortness of breath and chest heaviness. NSTEMI. Acute systolic congestive heart failure. Multi-vessel coronary artery disease. Planned CABG. EXAM: CT CHEST WITH CONTRAST TECHNIQUE: Multidetector CT imaging of the chest was performed during intravenous contrast administration. CONTRAST:  75 milliliters Isovue-300 ISOVUE-300 IOPAMIDOL (ISOVUE-300) INJECTION 61% COMPARISON:  Chest radiographs 09/01/2017 and earlier FINDINGS: Cardiovascular: Cardiac size is at the upper limits of  normal to mildly enlarged. No pericardial effusion. Severe bilateral calcified coronary artery atherosclerosis and/or stents. Moderate thoracic and visible upper abdominal aorta atherosclerosis with both soft and calcified plaque. Similar plaque at the bilateral great vessel origins. The central pulmonary arteries appear patent. Mediastinum/Nodes: Negative.  No lymphadenopathy. Lungs/Pleura: Small bilateral layering pleural effusions. Associated bilateral lower lobe compressive atelectasis. Trace atelectasis or scarring in the medial segment of the right middle lobe. No other abnormal pulmonary opacity. No evidence of acute pulmonary edema. The major airways are patent. Upper Abdomen: Negative visible liver, spleen, pancreas, adrenal glands and bowel in the upper abdomen. Musculoskeletal: Mild chronic appearing T12 superior endplate compression fracture or large Schmorl's node (sagittal image 59). Calcification of the T9-T10 disc. No acute osseous abnormality identified. IMPRESSION: 1. Small bilateral layering pleural effusions with compressive atelectasis but otherwise normal CT appearance of both lungs. 2. Calcified coronary artery and Aortic Atherosclerosis (ICD10-I70.0). Borderline to mild cardiomegaly. No pericardial effusion. Electronically Signed   By: Genevie Ann M.D.   On: 09/03/2017 11:57    Cardiac Studies   LV EF: 50% -   55%  ------------------------------------------------------------------- Indications:      MI - acute 410.91.  ------------------------------------------------------------------- History:   PMH:   Coronary artery disease.  Congestive heart failure.  ------------------------------------------------------------------- Study Conclusions  - Left ventricle: Wall thickness was increased in a pattern of   moderate LVH. Systolic function was normal. The estimated   ejection fraction was in the range of 50% to 55%. Wall motion was   normal; there were no regional wall motion  abnormalities. Doppler   parameters are consistent with abnormal left ventricular   relaxation (grade 1 diastolic dysfunction).  Assessment   Active Problems:   Non-ST elevation (NSTEMI) myocardial infarction (HCC)   Acute systolic CHF (congestive heart failure) (HCC)   CAD, multiple vessel   Plan   1. Euvolemic on exam - low normal LVEF on echo at 50-55%. Stable creatinine. Hold diuretics. Plan for CABG in am tomorrow.  Time Spent Directly with Patient:  I have spent a total of 15 minutes with the patient reviewing hospital notes, telemetry, EKGs, labs and examining the patient as well as establishing an assessment and plan that was discussed personally with the patient. > 50% of time was spent in direct patient care.  Length of Stay:  LOS: 3 days   Pixie Casino, MD, [facility], [place] Director of the Advanced Lipid Disorders &  Cardiovascular Risk Reduction Clinic Diplomate of the American Board of Clinical Lipidology Attending Cardiologist  Direct Dial: [phone]  Fax: [phone]  Website:  [email or web address]  Pixie Casino 09/04/2017,  10:29 AM

## 2017-09-05 ENCOUNTER — Inpatient Hospital Stay (HOSPITAL_COMMUNITY): Payer: BC Managed Care – PPO | Admitting: Anesthesiology

## 2017-09-05 ENCOUNTER — Inpatient Hospital Stay (HOSPITAL_COMMUNITY): Payer: BC Managed Care – PPO

## 2017-09-05 ENCOUNTER — Encounter (HOSPITAL_COMMUNITY): Payer: Self-pay | Admitting: Anesthesiology

## 2017-09-05 ENCOUNTER — Inpatient Hospital Stay (HOSPITAL_COMMUNITY)
Admission: EM | Disposition: A | Discharge status: Home / Self Care | Payer: Self-pay | Source: Home / Self Care | Attending: Cardiothoracic Surgery

## 2017-09-05 DIAGNOSIS — Z951 Presence of aortocoronary bypass graft: Secondary | ICD-10-CM

## 2017-09-05 DIAGNOSIS — I5021 Acute systolic (congestive) heart failure: Secondary | ICD-10-CM | POA: Diagnosis not present

## 2017-09-05 DIAGNOSIS — I214 Non-ST elevation (NSTEMI) myocardial infarction: Secondary | ICD-10-CM | POA: Diagnosis not present

## 2017-09-05 DIAGNOSIS — I25110 Atherosclerotic heart disease of native coronary artery with unstable angina pectoris: Secondary | ICD-10-CM | POA: Diagnosis not present

## 2017-09-05 DIAGNOSIS — T82855A Stenosis of coronary artery stent, initial encounter: Secondary | ICD-10-CM | POA: Diagnosis not present

## 2017-09-05 DIAGNOSIS — I2510 Atherosclerotic heart disease of native coronary artery without angina pectoris: Secondary | ICD-10-CM | POA: Diagnosis present

## 2017-09-05 DIAGNOSIS — J9811 Atelectasis: Secondary | ICD-10-CM | POA: Diagnosis not present

## 2017-09-05 HISTORY — PX: CORONARY ARTERY BYPASS GRAFT: SHX141

## 2017-09-05 HISTORY — PX: TEE WITHOUT CARDIOVERSION: SHX5443

## 2017-09-05 LAB — POCT I-STAT 3, ART BLOOD GAS (G3+)
ACID-BASE DEFICIT: 3 mmol/L — AB (ref 0.0–2.0)
Acid-Base Excess: 2 mmol/L (ref 0.0–2.0)
Acid-base deficit: 3 mmol/L — ABNORMAL HIGH (ref 0.0–2.0)
BICARBONATE: 26.3 mmol/L (ref 20.0–28.0)
BICARBONATE: 25.5 mmol/L (ref 20.0–28.0)
BICARBONATE: 22.5 mmol/L (ref 20.0–28.0)
Bicarbonate: 22.7 mmol/L (ref 20.0–28.0)
O2 Saturation: 100 %
O2 Saturation: 100 %
O2 Saturation: 94 %
O2 Saturation: 96 %
PCO2 ART: 43 mmHg (ref 32.0–48.0)
PCO2 ART: 43.1 mmHg (ref 32.0–48.0)
PH ART: 7.414 (ref 7.350–7.450)
PH ART: 7.327 — AB (ref 7.350–7.450)
PO2 ART: 240 mmHg — AB (ref 83.0–108.0)
Patient temperature: 37.1
TCO2: 28 mmol/L (ref 22–32)
TCO2: 27 mmol/L (ref 22–32)
TCO2: 24 mmol/L (ref 22–32)
TCO2: 24 mmol/L (ref 22–32)
pCO2 arterial: 41.1 mmHg (ref 32.0–48.0)
pCO2 arterial: 38.6 mmHg (ref 32.0–48.0)
pH, Arterial: 7.381 (ref 7.350–7.450)
pH, Arterial: 7.372 (ref 7.350–7.450)
pO2, Arterial: 406 mmHg — ABNORMAL HIGH (ref 83.0–108.0)
pO2, Arterial: 69 mmHg — ABNORMAL LOW (ref 83.0–108.0)
pO2, Arterial: 91 mmHg (ref 83.0–108.0)

## 2017-09-05 LAB — HEMOGLOBIN AND HEMATOCRIT, BLOOD
HCT: 30.5 % — ABNORMAL LOW (ref 39.0–52.0)
Hemoglobin: 10.5 g/dL — ABNORMAL LOW (ref 13.0–17.0)

## 2017-09-05 LAB — POCT I-STAT, CHEM 8
BUN: 13 mg/dL (ref 6–20)
BUN: 11 mg/dL (ref 6–20)
BUN: 9 mg/dL (ref 6–20)
BUN: 10 mg/dL (ref 6–20)
BUN: 10 mg/dL (ref 6–20)
BUN: 10 mg/dL (ref 6–20)
BUN: 11 mg/dL (ref 6–20)
CALCIUM ION: 1.15 mmol/L (ref 1.15–1.40)
CALCIUM ION: 1.15 mmol/L (ref 1.15–1.40)
CHLORIDE: 101 mmol/L (ref 101–111)
CHLORIDE: 102 mmol/L (ref 101–111)
CREATININE: 0.6 mg/dL — AB (ref 0.61–1.24)
Calcium, Ion: 1.22 mmol/L (ref 1.15–1.40)
Calcium, Ion: 1.02 mmol/L — ABNORMAL LOW (ref 1.15–1.40)
Calcium, Ion: 1.24 mmol/L (ref 1.15–1.40)
Calcium, Ion: 1.14 mmol/L — ABNORMAL LOW (ref 1.15–1.40)
Calcium, Ion: 1.15 mmol/L (ref 1.15–1.40)
Chloride: 101 mmol/L (ref 101–111)
Chloride: 100 mmol/L — ABNORMAL LOW (ref 101–111)
Chloride: 102 mmol/L (ref 101–111)
Chloride: 101 mmol/L (ref 101–111)
Chloride: 105 mmol/L (ref 101–111)
Creatinine, Ser: 0.6 mg/dL — ABNORMAL LOW (ref 0.61–1.24)
Creatinine, Ser: 0.5 mg/dL — ABNORMAL LOW (ref 0.61–1.24)
Creatinine, Ser: 0.5 mg/dL — ABNORMAL LOW (ref 0.61–1.24)
Creatinine, Ser: 0.6 mg/dL — ABNORMAL LOW (ref 0.61–1.24)
Creatinine, Ser: 0.6 mg/dL — ABNORMAL LOW (ref 0.61–1.24)
Creatinine, Ser: 0.6 mg/dL — ABNORMAL LOW (ref 0.61–1.24)
GLUCOSE: 135 mg/dL — AB (ref 65–99)
Glucose, Bld: 168 mg/dL — ABNORMAL HIGH (ref 65–99)
Glucose, Bld: 117 mg/dL — ABNORMAL HIGH (ref 65–99)
Glucose, Bld: 137 mg/dL — ABNORMAL HIGH (ref 65–99)
Glucose, Bld: 147 mg/dL — ABNORMAL HIGH (ref 65–99)
Glucose, Bld: 165 mg/dL — ABNORMAL HIGH (ref 65–99)
Glucose, Bld: 125 mg/dL — ABNORMAL HIGH (ref 65–99)
HCT: 32 % — ABNORMAL LOW (ref 39.0–52.0)
HCT: 31 % — ABNORMAL LOW (ref 39.0–52.0)
HEMATOCRIT: 36 % — AB (ref 39.0–52.0)
HEMATOCRIT: 32 % — AB (ref 39.0–52.0)
HEMATOCRIT: 35 % — AB (ref 39.0–52.0)
HEMATOCRIT: 31 % — AB (ref 39.0–52.0)
HEMATOCRIT: 32 % — AB (ref 39.0–52.0)
HEMOGLOBIN: 12.2 g/dL — AB (ref 13.0–17.0)
HEMOGLOBIN: 10.9 g/dL — AB (ref 13.0–17.0)
HEMOGLOBIN: 11.9 g/dL — AB (ref 13.0–17.0)
Hemoglobin: 10.9 g/dL — ABNORMAL LOW (ref 13.0–17.0)
Hemoglobin: 10.5 g/dL — ABNORMAL LOW (ref 13.0–17.0)
Hemoglobin: 10.5 g/dL — ABNORMAL LOW (ref 13.0–17.0)
Hemoglobin: 10.9 g/dL — ABNORMAL LOW (ref 13.0–17.0)
POTASSIUM: 3.8 mmol/L (ref 3.5–5.1)
POTASSIUM: 3.9 mmol/L (ref 3.5–5.1)
POTASSIUM: 4.2 mmol/L (ref 3.5–5.1)
POTASSIUM: 4.7 mmol/L (ref 3.5–5.1)
Potassium: 3.9 mmol/L (ref 3.5–5.1)
Potassium: 3.9 mmol/L (ref 3.5–5.1)
Potassium: 4.5 mmol/L (ref 3.5–5.1)
SODIUM: 138 mmol/L (ref 135–145)
SODIUM: 140 mmol/L (ref 135–145)
SODIUM: 138 mmol/L (ref 135–145)
SODIUM: 137 mmol/L (ref 135–145)
SODIUM: 140 mmol/L (ref 135–145)
Sodium: 141 mmol/L (ref 135–145)
Sodium: 139 mmol/L (ref 135–145)
TCO2: 29 mmol/L (ref 22–32)
TCO2: 27 mmol/L (ref 22–32)
TCO2: 27 mmol/L (ref 22–32)
TCO2: 26 mmol/L (ref 22–32)
TCO2: 26 mmol/L (ref 22–32)
TCO2: 25 mmol/L (ref 22–32)
TCO2: 21 mmol/L — AB (ref 22–32)

## 2017-09-05 LAB — GLUCOSE, CAPILLARY
GLUCOSE-CAPILLARY: 119 mg/dL — AB (ref 65–99)
GLUCOSE-CAPILLARY: 135 mg/dL — AB (ref 65–99)
GLUCOSE-CAPILLARY: 121 mg/dL — AB (ref 65–99)
GLUCOSE-CAPILLARY: 139 mg/dL — AB (ref 65–99)
Glucose-Capillary: 113 mg/dL — ABNORMAL HIGH (ref 65–99)
Glucose-Capillary: 97 mg/dL (ref 65–99)
Glucose-Capillary: 95 mg/dL (ref 65–99)
Glucose-Capillary: 76 mg/dL (ref 65–99)

## 2017-09-05 LAB — BASIC METABOLIC PANEL
Anion gap: 8 (ref 5–15)
BUN: 14 mg/dL (ref 6–20)
CALCIUM: 8.9 mg/dL (ref 8.9–10.3)
CO2: 25 mmol/L (ref 22–32)
CREATININE: 0.79 mg/dL (ref 0.61–1.24)
Chloride: 102 mmol/L (ref 101–111)
Glucose, Bld: 179 mg/dL — ABNORMAL HIGH (ref 65–99)
Potassium: 4.2 mmol/L (ref 3.5–5.1)
SODIUM: 135 mmol/L (ref 135–145)

## 2017-09-05 LAB — CBC
HCT: 34.9 % — ABNORMAL LOW (ref 39.0–52.0)
HCT: 35.7 % — ABNORMAL LOW (ref 39.0–52.0)
HEMATOCRIT: 43.2 % (ref 39.0–52.0)
Hemoglobin: 14.6 g/dL (ref 13.0–17.0)
Hemoglobin: 11.7 g/dL — ABNORMAL LOW (ref 13.0–17.0)
Hemoglobin: 12 g/dL — ABNORMAL LOW (ref 13.0–17.0)
MCH: 31.1 pg (ref 26.0–34.0)
MCH: 30.2 pg (ref 26.0–34.0)
MCH: 30.3 pg (ref 26.0–34.0)
MCHC: 33.8 g/dL (ref 30.0–36.0)
MCHC: 33.5 g/dL (ref 30.0–36.0)
MCHC: 33.6 g/dL (ref 30.0–36.0)
MCV: 91.9 fL (ref 78.0–100.0)
MCV: 90.2 fL (ref 78.0–100.0)
MCV: 90.2 fL (ref 78.0–100.0)
PLATELETS: 163 10*3/uL (ref 150–400)
Platelets: 228 10*3/uL (ref 150–400)
Platelets: 187 10*3/uL (ref 150–400)
RBC: 4.7 MIL/uL (ref 4.22–5.81)
RBC: 3.87 MIL/uL — ABNORMAL LOW (ref 4.22–5.81)
RBC: 3.96 MIL/uL — ABNORMAL LOW (ref 4.22–5.81)
RDW: 13.3 % (ref 11.5–15.5)
RDW: 12.9 % (ref 11.5–15.5)
RDW: 12.9 % (ref 11.5–15.5)
WBC: 6.1 10*3/uL (ref 4.0–10.5)
WBC: 9.8 10*3/uL (ref 4.0–10.5)
WBC: 10.9 10*3/uL — ABNORMAL HIGH (ref 4.0–10.5)

## 2017-09-05 LAB — POCT I-STAT 4, (NA,K, GLUC, HGB,HCT)
GLUCOSE: 149 mg/dL — AB (ref 65–99)
HCT: 31 % — ABNORMAL LOW (ref 39.0–52.0)
Hemoglobin: 10.5 g/dL — ABNORMAL LOW (ref 13.0–17.0)
POTASSIUM: 3.6 mmol/L (ref 3.5–5.1)
SODIUM: 141 mmol/L (ref 135–145)

## 2017-09-05 LAB — CREATININE, SERUM
Creatinine, Ser: 0.76 mg/dL (ref 0.61–1.24)
GFR calc Af Amer: >60 mL/min (ref >60)
GFR calc non Af Amer: >60 mL/min (ref >60)

## 2017-09-05 LAB — PROTIME-INR
INR: 1.25
PROTHROMBIN TIME: 15.6 s — AB (ref 11.4–15.2)

## 2017-09-05 LAB — APTT: aPTT: 34 seconds (ref 24–36)

## 2017-09-05 LAB — HEPARIN LEVEL (UNFRACTIONATED): Heparin Unfractionated: 0.46 IU/mL (ref 0.30–0.70)

## 2017-09-05 LAB — MAGNESIUM: Magnesium: 2.5 mg/dL — ABNORMAL HIGH (ref 1.7–2.4)

## 2017-09-05 LAB — PLATELET COUNT: Platelets: 176 10*3/uL (ref 150–400)

## 2017-09-05 SURGERY — CORONARY ARTERY BYPASS GRAFTING (CABG)
Anesthesia: General | Site: Chest

## 2017-09-05 MED ORDER — CEFAZOLIN SODIUM-DEXTROSE 2-4 GM/100ML-% IV SOLN
2.0000 g | Freq: Three times a day (TID) | INTRAVENOUS | Status: AC
  Administered 2017-09-05 – 2017-09-07 (×6): 2 g via INTRAVENOUS
  Filled 2017-09-05 (×7): qty 100

## 2017-09-05 MED ORDER — BISACODYL 5 MG PO TBEC
10.0000 mg | DELAYED_RELEASE_TABLET | Freq: Every day | ORAL | Status: DC
  Administered 2017-09-06 – 2017-09-10 (×5): 10 mg via ORAL
  Filled 2017-09-05 (×5): qty 2

## 2017-09-05 MED ORDER — ROCURONIUM BROMIDE 10 MG/ML (PF) SYRINGE
PREFILLED_SYRINGE | INTRAVENOUS | Status: AC
  Filled 2017-09-05: qty 5

## 2017-09-05 MED ORDER — MORPHINE SULFATE (PF) 2 MG/ML IV SOLN
2.0000 mg | INTRAVENOUS | Status: DC | PRN
  Administered 2017-09-07 (×2): 4 mg via INTRAVENOUS
  Administered 2017-09-08 (×2): 2 mg via INTRAVENOUS
  Filled 2017-09-05: qty 2
  Filled 2017-09-05: qty 1
  Filled 2017-09-05: qty 2
  Filled 2017-09-05 (×3): qty 1

## 2017-09-05 MED ORDER — FENTANYL CITRATE (PF) 250 MCG/5ML IJ SOLN
INTRAMUSCULAR | Status: DC | PRN
  Administered 2017-09-05: 100 ug via INTRAVENOUS
  Administered 2017-09-05: 50 ug via INTRAVENOUS
  Administered 2017-09-05: 150 ug via INTRAVENOUS
  Administered 2017-09-05 (×2): 50 ug via INTRAVENOUS
  Administered 2017-09-05: 100 ug via INTRAVENOUS
  Administered 2017-09-05: 150 ug via INTRAVENOUS
  Administered 2017-09-05 (×3): 100 ug via INTRAVENOUS
  Administered 2017-09-05 (×2): 50 ug via INTRAVENOUS

## 2017-09-05 MED ORDER — CHLORHEXIDINE GLUCONATE 4 % EX LIQD
CUTANEOUS | Status: AC
  Administered 2017-09-05: 4 via TOPICAL
  Filled 2017-09-05: qty 60

## 2017-09-05 MED ORDER — VANCOMYCIN HCL IN DEXTROSE 1-5 GM/200ML-% IV SOLN
1000.0000 mg | Freq: Once | INTRAVENOUS | Status: AC
  Administered 2017-09-05: 1000 mg via INTRAVENOUS
  Filled 2017-09-05: qty 200

## 2017-09-05 MED ORDER — ACETAMINOPHEN 500 MG PO TABS
1000.0000 mg | ORAL_TABLET | Freq: Four times a day (QID) | ORAL | Status: DC
  Administered 2017-09-06 – 2017-09-10 (×16): 1000 mg via ORAL
  Filled 2017-09-05 (×17): qty 2

## 2017-09-05 MED ORDER — HEPARIN SODIUM (PORCINE) 1000 UNIT/ML IJ SOLN
INTRAMUSCULAR | Status: DC | PRN
  Administered 2017-09-05: 2000 [IU] via INTRAVENOUS
  Administered 2017-09-05: 24000 [IU] via INTRAVENOUS
  Administered 2017-09-05: 2000 [IU] via INTRAVENOUS

## 2017-09-05 MED ORDER — PROTAMINE SULFATE 10 MG/ML IV SOLN
INTRAVENOUS | Status: DC | PRN
  Administered 2017-09-05: 280 mg via INTRAVENOUS

## 2017-09-05 MED ORDER — ONDANSETRON HCL 4 MG/2ML IJ SOLN
4.0000 mg | Freq: Four times a day (QID) | INTRAMUSCULAR | Status: DC | PRN
  Administered 2017-09-05 – 2017-09-06 (×2): 4 mg via INTRAVENOUS
  Filled 2017-09-05 (×2): qty 2

## 2017-09-05 MED ORDER — PROTAMINE SULFATE 10 MG/ML IV SOLN
INTRAVENOUS | Status: AC
  Filled 2017-09-05: qty 25

## 2017-09-05 MED ORDER — MIDAZOLAM HCL 2 MG/2ML IJ SOLN: 2.0000 mg | INTRAMUSCULAR | Status: DC | PRN

## 2017-09-05 MED ORDER — ORAL CARE MOUTH RINSE
15.0000 mL | Freq: Four times a day (QID) | OROMUCOSAL | Status: DC
  Administered 2017-09-05: 15 mL via OROMUCOSAL

## 2017-09-05 MED ORDER — PROPOFOL 10 MG/ML IV BOLUS
INTRAVENOUS | Status: DC | PRN
  Administered 2017-09-05: 130 mg via INTRAVENOUS

## 2017-09-05 MED ORDER — SODIUM CHLORIDE 0.9% FLUSH: 3.0000 mL | INTRAVENOUS | Status: DC | PRN

## 2017-09-05 MED ORDER — FAMOTIDINE IN NACL 20-0.9 MG/50ML-% IV SOLN
20.0000 mg | Freq: Two times a day (BID) | INTRAVENOUS | Status: AC
  Administered 2017-09-06: 20 mg via INTRAVENOUS
  Filled 2017-09-05: qty 50

## 2017-09-05 MED ORDER — SODIUM CHLORIDE 0.9 % IV SOLN: 250.0000 mL | INTRAVENOUS | Status: DC

## 2017-09-05 MED ORDER — MIDAZOLAM HCL 10 MG/2ML IJ SOLN
INTRAMUSCULAR | Status: AC
  Filled 2017-09-05: qty 2

## 2017-09-05 MED ORDER — ACETAMINOPHEN 160 MG/5ML PO SOLN: 1000.0000 mg | Freq: Four times a day (QID) | ORAL | Status: DC

## 2017-09-05 MED ORDER — SODIUM CHLORIDE 0.9 % IV SOLN
INTRAVENOUS | Status: DC
  Filled 2017-09-05: qty 1

## 2017-09-05 MED ORDER — PROTAMINE SULFATE 10 MG/ML IV SOLN
INTRAVENOUS | Status: AC
  Filled 2017-09-05: qty 5

## 2017-09-05 MED ORDER — METOPROLOL TARTRATE 5 MG/5ML IV SOLN: 2.5000 mg | INTRAVENOUS | Status: DC | PRN

## 2017-09-05 MED ORDER — LACTATED RINGERS IV SOLN
INTRAVENOUS | Status: DC | PRN
  Administered 2017-09-05: 07:00:00 via INTRAVENOUS

## 2017-09-05 MED ORDER — LIDOCAINE 2% (20 MG/ML) 5 ML SYRINGE
INTRAMUSCULAR | Status: DC | PRN
  Administered 2017-09-05: 30 mg via INTRAVENOUS

## 2017-09-05 MED ORDER — DOCUSATE SODIUM 100 MG PO CAPS
200.0000 mg | ORAL_CAPSULE | Freq: Every day | ORAL | Status: DC
  Administered 2017-09-06 – 2017-09-10 (×5): 200 mg via ORAL
  Filled 2017-09-05 (×5): qty 2

## 2017-09-05 MED ORDER — ROCURONIUM BROMIDE 10 MG/ML (PF) SYRINGE
PREFILLED_SYRINGE | INTRAVENOUS | Status: DC | PRN
  Administered 2017-09-05: 20 mg via INTRAVENOUS
  Administered 2017-09-05: 30 mg via INTRAVENOUS
  Administered 2017-09-05: 70 mg via INTRAVENOUS
  Administered 2017-09-05 (×2): 30 mg via INTRAVENOUS

## 2017-09-05 MED ORDER — THROMBIN 5000 UNITS EX SOLR
CUTANEOUS | Status: DC | PRN
  Administered 2017-09-05: 5000 [IU] via TOPICAL

## 2017-09-05 MED ORDER — NITROGLYCERIN IN D5W 200-5 MCG/ML-% IV SOLN: 0.0000 ug/min | INTRAVENOUS | Status: DC

## 2017-09-05 MED ORDER — METOCLOPRAMIDE HCL 5 MG/ML IJ SOLN
10.0000 mg | Freq: Four times a day (QID) | INTRAMUSCULAR | Status: DC
  Administered 2017-09-05 – 2017-09-10 (×14): 10 mg via INTRAVENOUS
  Filled 2017-09-05 (×15): qty 2

## 2017-09-05 MED ORDER — OXYCODONE HCL 5 MG PO TABS
5.0000 mg | ORAL_TABLET | ORAL | Status: DC | PRN
  Administered 2017-09-06 – 2017-09-09 (×12): 10 mg via ORAL
  Filled 2017-09-05 (×12): qty 2

## 2017-09-05 MED ORDER — PHENYLEPHRINE HCL 10 MG/ML IJ SOLN
0.0000 ug/min | INTRAMUSCULAR | Status: DC
  Filled 2017-09-05: qty 2

## 2017-09-05 MED ORDER — ALBUMIN HUMAN 5 % IV SOLN
250.0000 mL | INTRAVENOUS | Status: AC | PRN
  Administered 2017-09-05 (×3): 250 mL via INTRAVENOUS
  Filled 2017-09-05: qty 250

## 2017-09-05 MED ORDER — CHLORHEXIDINE GLUCONATE 0.12% ORAL RINSE (MEDLINE KIT)
15.0000 mL | Freq: Two times a day (BID) | OROMUCOSAL | Status: DC
  Administered 2017-09-05: 15 mL via OROMUCOSAL

## 2017-09-05 MED ORDER — SODIUM CHLORIDE 0.9 % IJ SOLN
INTRAMUSCULAR | Status: AC
  Filled 2017-09-05: qty 10

## 2017-09-05 MED ORDER — CHLORHEXIDINE GLUCONATE 0.12 % MT SOLN
15.0000 mL | OROMUCOSAL | Status: AC
  Administered 2017-09-05: 15 mL via OROMUCOSAL

## 2017-09-05 MED ORDER — PHENYLEPHRINE HCL 10 MG/ML IJ SOLN
INTRAVENOUS | Status: DC | PRN
  Administered 2017-09-05: 10 ug/min via INTRAVENOUS

## 2017-09-05 MED ORDER — METOPROLOL TARTRATE 25 MG/10 ML ORAL SUSPENSION: 12.5000 mg | Freq: Two times a day (BID) | ORAL | Status: DC

## 2017-09-05 MED ORDER — ASPIRIN EC 325 MG PO TBEC
325.0000 mg | DELAYED_RELEASE_TABLET | Freq: Every day | ORAL | Status: DC
  Administered 2017-09-06 – 2017-09-10 (×5): 325 mg via ORAL
  Filled 2017-09-05 (×5): qty 1

## 2017-09-05 MED ORDER — LACTATED RINGERS IV SOLN: 500.0000 mL | Freq: Once | INTRAVENOUS | Status: DC | PRN

## 2017-09-05 MED ORDER — HEMOSTATIC AGENTS (NO CHARGE) OPTIME
TOPICAL | Status: DC | PRN
  Administered 2017-09-05 (×2): 1 via TOPICAL

## 2017-09-05 MED ORDER — ACETAMINOPHEN 160 MG/5ML PO SOLN
650.0000 mg | Freq: Once | ORAL | Status: AC
  Administered 2017-09-05: 650 mg

## 2017-09-05 MED ORDER — METOPROLOL TARTRATE 12.5 MG HALF TABLET: 12.5000 mg | ORAL_TABLET | Freq: Two times a day (BID) | ORAL | Status: DC

## 2017-09-05 MED ORDER — INSULIN REGULAR BOLUS VIA INFUSION
0.0000 [IU] | Freq: Three times a day (TID) | INTRAVENOUS | Status: DC
  Filled 2017-09-05: qty 10

## 2017-09-05 MED ORDER — ASPIRIN 81 MG PO CHEW: 324.0000 mg | CHEWABLE_TABLET | Freq: Every day | ORAL | Status: DC

## 2017-09-05 MED ORDER — 0.9 % SODIUM CHLORIDE (POUR BTL) OPTIME
TOPICAL | Status: DC | PRN
  Administered 2017-09-05: 6000 mL

## 2017-09-05 MED ORDER — POTASSIUM CHLORIDE 10 MEQ/50ML IV SOLN
10.0000 meq | INTRAVENOUS | Status: AC
  Administered 2017-09-05 (×3): 10 meq via INTRAVENOUS

## 2017-09-05 MED ORDER — SODIUM CHLORIDE 0.45 % IV SOLN: INTRAVENOUS | Status: DC | PRN

## 2017-09-05 MED ORDER — MAGNESIUM SULFATE 4 GM/100ML IV SOLN
4.0000 g | Freq: Once | INTRAVENOUS | Status: AC
  Administered 2017-09-05: 4 g via INTRAVENOUS
  Filled 2017-09-05: qty 100

## 2017-09-05 MED ORDER — PROPOFOL 10 MG/ML IV BOLUS
INTRAVENOUS | Status: AC
  Filled 2017-09-05: qty 20

## 2017-09-05 MED ORDER — SODIUM CHLORIDE 0.9% FLUSH
3.0000 mL | Freq: Two times a day (BID) | INTRAVENOUS | Status: DC
  Administered 2017-09-06 – 2017-09-09 (×7): 3 mL via INTRAVENOUS

## 2017-09-05 MED ORDER — LACTATED RINGERS IV SOLN: INTRAVENOUS | Status: DC

## 2017-09-05 MED ORDER — SODIUM CHLORIDE 0.9 % IV SOLN
INTRAVENOUS | Status: DC | PRN
  Administered 2017-09-05: 13:00:00 via INTRAVENOUS

## 2017-09-05 MED ORDER — SODIUM CHLORIDE 0.9 % IV SOLN: INTRAVENOUS | Status: DC

## 2017-09-05 MED ORDER — DEXMEDETOMIDINE HCL IN NACL 200 MCG/50ML IV SOLN
0.0000 ug/kg/h | INTRAVENOUS | Status: DC
  Filled 2017-09-05: qty 50

## 2017-09-05 MED ORDER — TRAMADOL HCL 50 MG PO TABS
50.0000 mg | ORAL_TABLET | ORAL | Status: DC | PRN
  Administered 2017-09-06 – 2017-09-07 (×2): 100 mg via ORAL
  Filled 2017-09-05 (×2): qty 2

## 2017-09-05 MED ORDER — ACETAMINOPHEN 650 MG RE SUPP: 650.0000 mg | Freq: Once | RECTAL | Status: AC

## 2017-09-05 MED ORDER — CEFUROXIME SODIUM 750 MG IJ SOLR
INTRAMUSCULAR | Status: DC | PRN
  Administered 2017-09-05: 750 mg via INTRAVENOUS

## 2017-09-05 MED ORDER — PANTOPRAZOLE SODIUM 40 MG PO TBEC
40.0000 mg | DELAYED_RELEASE_TABLET | Freq: Every day | ORAL | Status: DC
  Administered 2017-09-07 – 2017-09-10 (×4): 40 mg via ORAL
  Filled 2017-09-05 (×4): qty 1

## 2017-09-05 MED ORDER — MILRINONE LACTATE IN DEXTROSE 20-5 MG/100ML-% IV SOLN
0.1250 ug/kg/min | INTRAVENOUS | Status: DC
  Administered 2017-09-06 – 2017-09-07 (×2): 0.25 ug/kg/min via INTRAVENOUS
  Filled 2017-09-05 (×3): qty 100

## 2017-09-05 MED ORDER — LIDOCAINE HCL (CARDIAC) 20 MG/ML IV SOLN
INTRAVENOUS | Status: AC
  Filled 2017-09-05: qty 5

## 2017-09-05 MED ORDER — DOPAMINE-DEXTROSE 3.2-5 MG/ML-% IV SOLN: 0.0000 ug/kg/min | INTRAVENOUS | Status: DC

## 2017-09-05 MED ORDER — MIDAZOLAM HCL 5 MG/5ML IJ SOLN
INTRAMUSCULAR | Status: DC | PRN
  Administered 2017-09-05 (×5): 2 mg via INTRAVENOUS

## 2017-09-05 MED ORDER — FENTANYL CITRATE (PF) 250 MCG/5ML IJ SOLN
INTRAMUSCULAR | Status: AC
  Filled 2017-09-05: qty 25

## 2017-09-05 MED ORDER — LACTATED RINGERS IV SOLN
INTRAVENOUS | Status: DC
  Administered 2017-09-06: 16:00:00 via INTRAVENOUS

## 2017-09-05 MED ORDER — BISACODYL 10 MG RE SUPP: 10.0000 mg | Freq: Every day | RECTAL | Status: DC

## 2017-09-05 MED ORDER — HEPARIN SODIUM (PORCINE) 1000 UNIT/ML IJ SOLN
INTRAMUSCULAR | Status: AC
  Filled 2017-09-05: qty 1

## 2017-09-05 MED ORDER — THROMBIN 5000 UNITS EX SOLR
CUTANEOUS | Status: AC
  Filled 2017-09-05: qty 5000

## 2017-09-05 MED ORDER — SODIUM CHLORIDE 0.9 % IJ SOLN
OROMUCOSAL | Status: DC | PRN
  Administered 2017-09-05 (×3): 4 mL via TOPICAL

## 2017-09-05 MED ORDER — MORPHINE SULFATE (PF) 2 MG/ML IV SOLN
1.0000 mg | INTRAVENOUS | Status: DC | PRN
  Administered 2017-09-05 (×2): 2 mg via INTRAVENOUS
  Administered 2017-09-05: 1 mg via INTRAVENOUS
  Administered 2017-09-05 – 2017-09-06 (×3): 2 mg via INTRAVENOUS
  Filled 2017-09-05 (×4): qty 1

## 2017-09-05 SURGICAL SUPPLY — 99 items
ADAPTER CARDIO PERF ANTE/RETRO (ADAPTER) ×4 IMPLANT
BAG DECANTER FOR FLEXI CONT (MISCELLANEOUS) ×4 IMPLANT
BANDAGE ACE 4X5 VEL STRL LF (GAUZE/BANDAGES/DRESSINGS) ×4 IMPLANT
BANDAGE ACE 6X5 VEL STRL LF (GAUZE/BANDAGES/DRESSINGS) ×4 IMPLANT
BASKET HEART  (ORDER IN 25'S) (MISCELLANEOUS) ×1
BASKET HEART (ORDER IN 25'S) (MISCELLANEOUS) ×1
BASKET HEART (ORDER IN 25S) (MISCELLANEOUS) ×2 IMPLANT
BLADE CLIPPER SURG (BLADE) IMPLANT
BLADE STERNUM SYSTEM 6 (BLADE) ×4 IMPLANT
BLADE SURG 11 STRL SS (BLADE) ×4 IMPLANT
BLADE SURG 12 STRL SS (BLADE) ×4 IMPLANT
BNDG GAUZE ELAST 4 BULKY (GAUZE/BANDAGES/DRESSINGS) ×4 IMPLANT
CANISTER SUCT 3000ML PPV (MISCELLANEOUS) ×4 IMPLANT
CANNULA GUNDRY RCSP 15FR (MISCELLANEOUS) ×4 IMPLANT
CATH CPB KIT VANTRIGT (MISCELLANEOUS) ×4 IMPLANT
CATH ROBINSON RED A/P 18FR (CATHETERS) ×12 IMPLANT
CATH THORACIC 36FR RT ANG (CATHETERS) ×4 IMPLANT
CLIP FOGARTY SPRING 6M (CLIP) ×4 IMPLANT
CLIP VESOCCLUDE SM WIDE 24/CT (CLIP) ×8 IMPLANT
CRADLE DONUT ADULT HEAD (MISCELLANEOUS) ×4 IMPLANT
DERMABOND ADHESIVE PROPEN (GAUZE/BANDAGES/DRESSINGS) ×2
DERMABOND ADVANCED .7 DNX6 (GAUZE/BANDAGES/DRESSINGS) ×2 IMPLANT
DRAIN CHANNEL 32F RND 10.7 FF (WOUND CARE) ×4 IMPLANT
DRAPE CARDIOVASCULAR INCISE (DRAPES) ×2
DRAPE SLUSH/WARMER DISC (DRAPES) ×4 IMPLANT
DRAPE SRG 135X102X78XABS (DRAPES) ×2 IMPLANT
DRSG AQUACEL AG ADV 3.5X14 (GAUZE/BANDAGES/DRESSINGS) ×4 IMPLANT
ELECT BLADE 4.0 EZ CLEAN MEGAD (MISCELLANEOUS) ×4
ELECT BLADE 6.5 EXT (BLADE) ×4 IMPLANT
ELECT CAUTERY BLADE 6.4 (BLADE) ×4 IMPLANT
ELECT REM PT RETURN 9FT ADLT (ELECTROSURGICAL) ×8
ELECTRODE BLDE 4.0 EZ CLN MEGD (MISCELLANEOUS) ×2 IMPLANT
ELECTRODE REM PT RTRN 9FT ADLT (ELECTROSURGICAL) ×4 IMPLANT
FELT TEFLON 1X6 (MISCELLANEOUS) ×8 IMPLANT
GAUZE SPONGE 4X4 12PLY STRL (GAUZE/BANDAGES/DRESSINGS) ×8 IMPLANT
GLOVE BIO SURGEON STRL SZ 6.5 (GLOVE) ×9 IMPLANT
GLOVE BIO SURGEON STRL SZ7.5 (GLOVE) ×12 IMPLANT
GLOVE BIO SURGEONS STRL SZ 6.5 (GLOVE) ×3
GLOVE BIOGEL PI IND STRL 6 (GLOVE) ×6 IMPLANT
GLOVE BIOGEL PI IND STRL 7.0 (GLOVE) ×6 IMPLANT
GLOVE BIOGEL PI INDICATOR 6 (GLOVE) ×6
GLOVE BIOGEL PI INDICATOR 7.0 (GLOVE) ×6
GOWN STRL REUS W/ TWL LRG LVL3 (GOWN DISPOSABLE) ×8 IMPLANT
GOWN STRL REUS W/TWL LRG LVL3 (GOWN DISPOSABLE) ×8
HEMOSTAT POWDER SURGIFOAM 1G (HEMOSTASIS) ×12 IMPLANT
HEMOSTAT SURGICEL 2X14 (HEMOSTASIS) ×4 IMPLANT
INSERT FOGARTY XLG (MISCELLANEOUS) IMPLANT
KIT BASIN OR (CUSTOM PROCEDURE TRAY) ×4 IMPLANT
KIT SUCTION CATH 14FR (SUCTIONS) ×4 IMPLANT
KIT TURNOVER KIT B (KITS) ×4 IMPLANT
KIT VASOVIEW HEMOPRO VH 3000 (KITS) ×4 IMPLANT
LEAD PACING MYOCARDI (MISCELLANEOUS) ×4 IMPLANT
MARKER GRAFT CORONARY BYPASS (MISCELLANEOUS) ×12 IMPLANT
NS IRRIG 1000ML POUR BTL (IV SOLUTION) ×24 IMPLANT
PACK E OPEN HEART (SUTURE) ×4 IMPLANT
PACK OPEN HEART (CUSTOM PROCEDURE TRAY) ×4 IMPLANT
PAD ARMBOARD 7.5X6 YLW CONV (MISCELLANEOUS) ×8 IMPLANT
PAD ELECT DEFIB RADIOL ZOLL (MISCELLANEOUS) ×4 IMPLANT
PENCIL BUTTON HOLSTER BLD 10FT (ELECTRODE) ×8 IMPLANT
PUNCH AORTIC ROTATE 4.0MM (MISCELLANEOUS) IMPLANT
PUNCH AORTIC ROTATE 4.5MM 8IN (MISCELLANEOUS) IMPLANT
PUNCH AORTIC ROTATE 5MM 8IN (MISCELLANEOUS) IMPLANT
SET CARDIOPLEGIA MPS 5001102 (MISCELLANEOUS) ×4 IMPLANT
SLEEVE SURGEON STRL (DRAPES) ×4 IMPLANT
SPONGE LAP 18X18 X RAY DECT (DISPOSABLE) ×4 IMPLANT
SURGIFLO W/THROMBIN 8M KIT (HEMOSTASIS) ×4 IMPLANT
SUT BONE WAX W31G (SUTURE) ×4 IMPLANT
SUT MNCRL AB 4-0 PS2 18 (SUTURE) ×4 IMPLANT
SUT PROLENE 3 0 SH DA (SUTURE) IMPLANT
SUT PROLENE 3 0 SH1 36 (SUTURE) IMPLANT
SUT PROLENE 4 0 RB 1 (SUTURE) ×2
SUT PROLENE 4 0 SH DA (SUTURE) ×4 IMPLANT
SUT PROLENE 4-0 RB1 .5 CRCL 36 (SUTURE) ×2 IMPLANT
SUT PROLENE 5 0 C 1 36 (SUTURE) IMPLANT
SUT PROLENE 6 0 C 1 30 (SUTURE) ×4 IMPLANT
SUT PROLENE 6 0 CC (SUTURE) ×28 IMPLANT
SUT PROLENE 8 0 BV175 6 (SUTURE) ×8 IMPLANT
SUT PROLENE BLUE 7 0 (SUTURE) ×12 IMPLANT
SUT SILK  1 MH (SUTURE)
SUT SILK 1 MH (SUTURE) IMPLANT
SUT SILK 2 0 SH CR/8 (SUTURE) ×4 IMPLANT
SUT SILK 3 0 SH CR/8 (SUTURE) IMPLANT
SUT STEEL 6MS V (SUTURE) ×8 IMPLANT
SUT STEEL SZ 6 DBL 3X14 BALL (SUTURE) ×4 IMPLANT
SUT VIC AB 1 CTX 36 (SUTURE) ×4
SUT VIC AB 1 CTX36XBRD ANBCTR (SUTURE) ×4 IMPLANT
SUT VIC AB 2-0 CT1 27 (SUTURE) ×2
SUT VIC AB 2-0 CT1 TAPERPNT 27 (SUTURE) ×2 IMPLANT
SUT VIC AB 2-0 CTX 27 (SUTURE) IMPLANT
SUT VIC AB 3-0 X1 27 (SUTURE) IMPLANT
SYSTEM SAHARA CHEST DRAIN ATS (WOUND CARE) ×4 IMPLANT
TAPE CLOTH SURG 4X10 WHT LF (GAUZE/BANDAGES/DRESSINGS) ×4 IMPLANT
TAPE PAPER 2X10 WHT MICROPORE (GAUZE/BANDAGES/DRESSINGS) ×4 IMPLANT
TOWEL GREEN STERILE (TOWEL DISPOSABLE) ×4 IMPLANT
TOWEL GREEN STERILE FF (TOWEL DISPOSABLE) ×4 IMPLANT
TRAY FOLEY SILVER 16FR TEMP (SET/KITS/TRAYS/PACK) ×4 IMPLANT
TUBING INSUFFLATION (TUBING) ×4 IMPLANT
UNDERPAD 30X30 (UNDERPADS AND DIAPERS) ×4 IMPLANT
WATER STERILE IRR 1000ML POUR (IV SOLUTION) ×8 IMPLANT

## 2017-09-05 NOTE — Anesthesia Procedure Notes (Signed)
Procedure Name: Intubation Date/Time: 09/05/2017 7:42 AM Performed by: Kyung Rudd, CRNA Pre-anesthesia Checklist: Patient identified, Emergency Drugs available, Suction available and Patient being monitored Patient Re-evaluated:Patient Re-evaluated prior to induction Oxygen Delivery Method: Circle system utilized Preoxygenation: Pre-oxygenation with 100% oxygen Induction Type: IV induction Ventilation: Mask ventilation without difficulty Laryngoscope Size: Mac and 4 Grade View: Grade II Tube type: Oral Tube size: 8.0 mm Number of attempts: 1 Airway Equipment and Method: Stylet Placement Confirmation: ETT inserted through vocal cords under direct vision,  positive ETCO2 and breath sounds checked- equal and bilateral Secured at: 21 cm Tube secured with: Tape Dental Injury: Teeth and Oropharynx as per pre-operative assessment

## 2017-09-05 NOTE — Progress Notes (Signed)
Initiated Open Heart Rapid Wean per Protocol 

## 2017-09-05 NOTE — Progress Notes (Signed)
  Echocardiogram Echocardiogram Transesophageal has been performed.  Celene SkeenVijay  Avory Rahimi 09/05/2017, 8:04 AM

## 2017-09-05 NOTE — Progress Notes (Signed)
Pre Procedure note for inpatients:   Curtis Little has been scheduled for Procedure(s): CORONARY ARTERY BYPASS GRAFTING (CABG) (N/A) TRANSESOPHAGEAL ECHOCARDIOGRAM (TEE) (N/A) today. The various methods of treatment have been discussed with the patient. After consideration of the risks, benefits and treatment options the patient has consented to the planned procedure.   The patient has been seen and labs reviewed. There are no changes in the patient's condition to prevent proceeding with the planned procedure today.  Recent labs:  Lab Results  Component Value Date   WBC 6.1 09/05/2017   HGB 14.6 09/05/2017   HCT 43.2 09/05/2017   PLT 228 09/05/2017   GLUCOSE 179 (H) 09/05/2017   CHOL 167 09/02/2017   TRIG 142 09/02/2017   HDL 34 (L) 09/02/2017   LDLCALC 105 (H) 09/02/2017   ALT 27 09/02/2017   AST 68 (H) 09/02/2017   NA 135 09/05/2017   K 4.2 09/05/2017   CL 102 09/05/2017   CREATININE 0.79 09/05/2017   BUN 14 09/05/2017   CO2 25 09/05/2017   TSH 2.058 09/02/2017   INR 1.09 09/02/2017   HGBA1C 9.1 (H) 09/02/2017    Curtis BussingPeter Van Trigt III, MD 09/05/2017 6:54 AM

## 2017-09-05 NOTE — Procedures (Signed)
Extubation Procedure Note  Patient Details:   Name: Curtis Little DOB: [birth year] MRN: [id number]   Airway Documentation:     Evaluation  O2 sats: stable throughout Complications: No apparent complications Patient did tolerate procedure well. Bilateral Breath Sounds: Clear, Diminished   Yes  Pt extubated per rapid wean protocol, NARD VSS, VC/NIF acceptable, ABG WNL, No stridor noted, Pt able to speak. RN at bedside, will cont to monitor.   Betsy PriesJessica  Cady Hafen 09/05/2017, 6:07 PM

## 2017-09-05 NOTE — Anesthesia Procedure Notes (Signed)
Central Venous Catheter Insertion Performed by: Dorris SinghGreen, Sutter Ahlgren, MD, anesthesiologist Start/End4/09/2017 6:45 AM, 09/05/2017 7:00 AM Preanesthetic checklist: patient identified, IV checked, site marked, risks and benefits discussed, surgical consent, monitors and equipment checked, pre-op evaluation and timeout performed Position: Trendelenburg Lidocaine 1% used for infiltration and patient sedated Hand hygiene performed , maximum sterile barriers used  and Seldinger technique used PA cath was placed.Sheath introducer Procedure performed using ultrasound guided technique. Ultrasound Notes:anatomy identified Attempts: 1 Following insertion, line sutured. Post procedure assessment: blood return through all ports  Patient tolerated the procedure well with no immediate complications.

## 2017-09-05 NOTE — Anesthesia Procedure Notes (Signed)
Arterial Line Insertion Start/End4/09/2017 6:55 AM, 09/05/2017 7:05 AM Performed by: Quentin OreWalker, Kyrene Longan E, CRNA, CRNA  Patient location: Pre-op. Preanesthetic checklist: patient identified, IV checked, site marked, risks and benefits discussed, surgical consent, monitors and equipment checked, pre-op evaluation and timeout performed Lidocaine 1% used for infiltration and patient sedated Left, radial was placed Catheter size: 20 G Hand hygiene performed , maximum sterile barriers used  and Seldinger technique used Allen's test indicative of satisfactory collateral circulation Attempts: 1 Procedure performed without using ultrasound guided technique. Following insertion, Biopatch and dressing applied. Patient tolerated the procedure well with no immediate complications.

## 2017-09-05 NOTE — OR Nursing (Signed)
12:30 - 45 minute call to SICU charge nurse 13:00 - 20  minute call to SICU charge nurse

## 2017-09-05 NOTE — Op Note (Signed)
Curtis Little, AFZAL NO.:  [id number]  MEDICAL RECORD NO.:  [id number]  LOCATION:  2H05C                        FACILITY:  [facility]  PHYSICIAN:  Kerin Perna, M.D.  DATE OF BIRTH:  December 10, 1950  DATE OF PROCEDURE:  09/05/2017 DATE OF DISCHARGE:                              OPERATIVE REPORT   OPERATION: 1. Coronary artery bypass grafting x4 (left internal mammary artery to     LAD, saphenous vein graft to diagonal, saphenous vein graft to the     OM1, and saphenous vein graft to right coronary artery). 2. Endoscopic harvest of right leg greater saphenous vein.  SURGEON:  Kerin Perna, M.D.  ASSISTANT:  Doree Fudge, PA-C.  PREOPERATIVE DIAGNOSIS:  Acute myocardial infarction, non-ST segment elevation myocardial infarction, left main and severe 3 vessel coronary artery disease, moderate preoperative LV dysfunction.  POSTOPERATIVE DIAGNOSIS:  Acute myocardial infarction, non-ST segment elevation myocardial infarction, left main and severe 3 vessel coronary artery disease, moderate preoperative LV dysfunction.  ANESTHESIA:  General by Dr. Shona Simpson.  CLINICAL NOTE:  The patient is a 67 year old diabetic from [place], [place], who was transferred to this hospital with a non-STEMI with positive cardiac enzymes and chest pain.  On admission, he had some hypotension and evidence of pulmonary edema on x-ray.  He was treated medically and underwent cardiac catheterization.  This demonstrated a haziness in the left main as well as high-grade stenosis of the LAD diagonal, circumflex marginal, and mid RCA.  EF was 35% to 40%.  There was no valvular insufficiency.  The patient was felt to be a candidate for surgical revascularization.  His troponin climbed up to 30 and then started to resolve.  His symptoms of failure improved and his chest pain was resolved.  His preoperative Doppler study showed no significant carotid disease, and his preoperative CT  scan showed resolution of his pulmonary edema without significant aortic disease.  His cardiologist recommended multivessel CABG because of his coronary anatomy and I agreed with that recommendation.  I saw the patient in consultation and reviewed the coronary angiograms.  I discussed the procedure of CABG with the patient and his family for treatment of his severe CAD.  He understood the details of surgery including the use of general anesthesia and cardiopulmonary bypass, the plan for multivessel bypass grafting, the use of mammary artery and saphenous vein as conduit, and the location of the surgical incisions.  He understood the expected postoperative recovery as well as the potential risks for stroke, bleeding, blood transfusion, pulmonary problems including pleural effusion, arrhythmias, infection, organ failure, and death.  He demonstrated his understanding of these issues and agreed to proceed with surgery under what I felt was an informed consent.  OPERATIVE FINDINGS: 1. Adequate conduit, although the vein graft to the circumflex     marginal was somewhat small. 2. No blood products used. 3. Preserved LV systolic function after separation from     cardiopulmonary bypass.  DESCRIPTION OF PROCEDURE:  The patient was brought to the operating room and placed supine on the operating table.  General anesthesia was induced under invasive hemodynamic monitoring.  The chest, abdomen, and legs were prepped with  Betadine and draped as a sterile field.  A sternal incision was made as the saphenous vein was harvested endoscopically from the right leg.  The left internal mammary artery was harvested as a pedicle graft from its origin at the subclavian vessels. It was 1.5-mm vessel with good flow.  The sternal retractor was placed and the pericardium was opened and suspended.  Pursestrings were placed in the ascending aorta and right atrium and after heparin was administered and the ACT  was documented as being therapeutic, the patient was cannulated and placed on bypass.  The coronaries were identified for grafting and the mammary artery and vein grafts were prepared for the distal anastomoses.  Cardioplegia cannulas were placed for both antegrade aortic and retrograde coronary sinus cardioplegia.  The patient was cooled to 32 degrees and an aortic crossclamp was applied.  1 L of cold blood cardioplegia was delivered in split doses between the antegrade aortic and retrograde coronary sinus catheters.  There was good cardioplegic arrest and septal temperature dropped less than 14 degrees.  Cardioplegia was delivered every 20 minutes or less.  The distal coronary anastomoses were performed.  The first distal anastomosis was the RCA mid vessel.  There was a proximal 95% stenosis. The vessel was 1.5 mm.  It was in the AV groove.  A reverse saphenous vein was sewn end-to-side with running 7-0 Prolene.  There was good flow through the graft.  Cardioplegia was redosed.  The second distal anastomosis was the OM1 branch of the left circumflex. This had a proximal 90% stenosis.  A reverse saphenous vein was sewn end- to-side with running 7-0 Prolene to this 1.5-mm vessel and there was good flow through the graft.  Cardioplegia was redosed.  The third distal anastomosis was to the 1st diagonal of the LAD.  It has a proximal 90% stenosis.  A reverse saphenous vein was sewn end-to-side with a running 7-0 Prolene with good flow through the graft. Cardioplegia was redosed.  The fourth distal anastomosis was to the mid to distal third of the LAD. It was 1.7-mm vessel with proximal 90% stenosis.  The left IMA pedicle was brought through an opening and the left lateral pericardium was brought down onto the LAD and sewn end-to-side with running 8-0 Prolene. There was good flow through the anastomosis after briefly releasing the pedicle bulldog on the mammary artery.  The bulldog was  reapplied and the pedicle was secured to the epicardium with 6-0 Prolene. Cardioplegia was redosed.  While the crossclamp was still in place, 3 proximal vein anastomoses were performed on the aorta using a 4.5-mm punch with a running 6-0 Prolene.  Prior to tying down the final proximal anastomosis, air was vented from the coronaries with a dose of retrograde warm blood cardioplegia in the usual de-airing maneuvers on bypass.  The crossclamp was removed.  The heart resumed a spontaneous rhythm.  The vein grafts were de-aired and opened and each had good flow and hemostasis was documented to the proximal and distal sites.  The patient was rewarmed and reperfused.  Temporary pacing wires were applied.  The lungs were expanded.  The patient was started on low-dose milrinone.  The patient weaned off cardiopulmonary bypass without difficulty.  Global LV function was preserved.  It should be noted that the lateral wall in the area of the circumflex distribution had some edema and mild post MI changes, but no thick scar.  This area seemed to have better function after separation from cardiopulmonary bypass.  The cannulas were removed and protamine was administered without adverse reaction.  The mediastinum was irrigated and the superior pericardial fat was closed over the aorta and vein grafts.  Anterior mediastinal and left pleural chest tubes were placed and brought out through separate incisions.  The sternum was closed with wire.  The patient remained stable.  The pectoralis fascia was closed with a running Vicryl.  The subcutaneous and skin layers were closed with running Vicryl and sterile dressings were applied.  The total cardiopulmonary bypass time was 128 minutes.     Kerin PernaPeter Van Trigt, M.D.     PV/MEDQ  D:  09/05/2017  T:  09/05/2017  Job:  [id number]

## 2017-09-05 NOTE — Anesthesia Preprocedure Evaluation (Addendum)
Anesthesia Evaluation  Patient identified by MRN, date of birth, ID band Patient awake    Reviewed: Allergy & Precautions, NPO status , Patient's Chart, lab work & pertinent test results  Airway Mallampati: III  TM Distance: <3 FB Neck ROM: Full    Dental  (+) Teeth Intact, Dental Advisory Given   Pulmonary former smoker,     + decreased breath sounds      Cardiovascular hypertension, Pt. on home beta blockers + CAD, + Past MI and +CHF   Rhythm:Regular Rate:Normal     Neuro/Psych negative neurological ROS  negative psych ROS   GI/Hepatic negative GI ROS, Neg liver ROS,   Endo/Other  diabetes, Type 2, Oral Hypoglycemic Agents  Renal/GU negative Renal ROS  negative genitourinary   Musculoskeletal negative musculoskeletal ROS (+)   Abdominal Normal abdominal exam  (+)   Peds  Hematology negative hematology ROS (+)   Anesthesia Other Findings   Reproductive/Obstetrics                           Lab Results  Component Value Date   WBC 6.1 09/05/2017   HGB 14.6 09/05/2017   HCT 43.2 09/05/2017   MCV 91.9 09/05/2017   PLT 228 09/05/2017   Lab Results  Component Value Date   CREATININE 0.79 09/05/2017   BUN 14 09/05/2017   NA 135 09/05/2017   K 4.2 09/05/2017   CL 102 09/05/2017   CO2 25 09/05/2017   Lab Results  Component Value Date   INR 1.09 09/02/2017   Echo: - Left ventricle: Wall thickness was increased in a pattern of   moderate LVH. Systolic function was normal. The estimated   ejection fraction was in the range of 50% to 55%. Wall motion was   normal; there were no regional wall motion abnormalities. Doppler   parameters are consistent with abnormal left ventricular   relaxation (grade 1 diastolic dysfunction).  Anesthesia Physical Anesthesia Plan  ASA: IV  Anesthesia Plan: General   Post-op Pain Management:    Induction: Intravenous  PONV Risk Score and Plan: 3  and Treatment may vary due to age or medical condition  Airway Management Planned: Oral ETT  Additional Equipment: Arterial line, CVP, PA Cath and TEE  Intra-op Plan:   Post-operative Plan: Post-operative intubation/ventilation  Informed Consent:   Plan Discussed with: CRNA  Anesthesia Plan Comments:         Anesthesia Quick Evaluation

## 2017-09-05 NOTE — Progress Notes (Signed)
TCTS BRIEF SICU PROGRESS NOTE  Day of Surgery  S/P Procedure(s) (LRB): CORONARY ARTERY BYPASS GRAFTING (CABG) x four , using left internal mammary artery and right leg greater saphenous vein harvested endoscopically (N/A) TRANSESOPHAGEAL ECHOCARDIOGRAM (TEE) (N/A)   Extubated uneventfully NSR - sinus tach w/ stable hemodynamics on low dose milrinone and dopamine Breathing comfortably w/ O2 sats 97% on 2 L/min Chest tube output low UOP excellent Labs okay  Plan: Continue routine early postop  Purcell Nailslarence H Owen, MD 09/05/2017 7:42 PM

## 2017-09-05 NOTE — Brief Op Note (Signed)
09/01/2017 - 09/05/2017  11:40 AM  PATIENT:  Curtis Little  67 y.o. male  PRE-OPERATIVE DIAGNOSIS:  1. S/p NSTEMI 2.CAD  POST-OPERATIVE DIAGNOSIS:  1. S/p NSTEMI 2.CAD  PROCEDURE: TRANSESOPHAGEAL ECHOCARDIOGRAM (TEE),  MEDIAN STERNOTOMY for CORONARY ARTERY BYPASS GRAFTING (CABG) x four , (LIMA to LAD, SVG to DIAGONAL, SVG to OM, SVG to RCA)using left internal mammary artery and right greater saphenous vein harvested endoscopically   SURGEON:  Surgeon(s) and Role:    Kerin PernaVan Trigt, Peter, MD - Primary  PHYSICIAN ASSISTANT: Doree Fudgeonielle Zimmerman PA-C  ASSISTANTS: Tanda Rockerserry Wagoner RNFA  ANESTHESIA:   general  BLOOD ADMINISTERED:Two FFP and one PLTS  DRAINS: Chest tubes placed in the mediastinal and pleural spaces   COUNTS CORRECT:  YES  DICTATION: .Dragon Dictation  PLAN OF CARE: Admit to inpatient   PATIENT DISPOSITION:  ICU - intubated and hemodynamically stable.   Delay start of Pharmacological VTE agent (>24hrs) due to surgical blood loss or risk of bleeding: yes  BASELINE WEIGHT: 79.5 kg

## 2017-09-05 NOTE — Transfer of Care (Signed)
Immediate Anesthesia Transfer of Care Note  Patient: Curtis Little  Procedure(s) Performed: CORONARY ARTERY BYPASS GRAFTING (CABG) x four , using left internal mammary artery and right leg greater saphenous vein harvested endoscopically (N/A Chest) TRANSESOPHAGEAL ECHOCARDIOGRAM (TEE) (N/A )  Patient Location: SICU  Anesthesia Type:General  Level of Consciousness: sedated and Patient remains intubated per anesthesia plan  Airway & Oxygen Therapy: Patient remains intubated per anesthesia plan and Patient placed on Ventilator (see vital sign flow sheet for setting)  Post-op Assessment: Report given to RN and Post -op Vital signs reviewed and stable  Post vital signs: Reviewed and stable  Last Vitals:  Vitals Value Taken Time  BP    Temp    Pulse 90 09/05/2017  1:48 PM  Resp 26 09/05/2017  1:48 PM  SpO2 95 % 09/05/2017  1:48 PM  Vitals shown include unvalidated device data.  Last Pain:  Vitals:   09/05/17 0400  TempSrc:   PainSc: 0-No pain      Patients Stated Pain Goal: 0 (09/02/17 1800)  Complications: No apparent anesthesia complications

## 2017-09-06 ENCOUNTER — Inpatient Hospital Stay (HOSPITAL_COMMUNITY): Payer: BC Managed Care – PPO

## 2017-09-06 ENCOUNTER — Encounter (HOSPITAL_COMMUNITY): Payer: Self-pay | Admitting: Cardiothoracic Surgery

## 2017-09-06 DIAGNOSIS — T82855A Stenosis of coronary artery stent, initial encounter: Secondary | ICD-10-CM | POA: Diagnosis not present

## 2017-09-06 DIAGNOSIS — J9811 Atelectasis: Secondary | ICD-10-CM | POA: Diagnosis not present

## 2017-09-06 DIAGNOSIS — Z951 Presence of aortocoronary bypass graft: Secondary | ICD-10-CM

## 2017-09-06 DIAGNOSIS — I214 Non-ST elevation (NSTEMI) myocardial infarction: Secondary | ICD-10-CM | POA: Diagnosis not present

## 2017-09-06 DIAGNOSIS — I5021 Acute systolic (congestive) heart failure: Secondary | ICD-10-CM | POA: Diagnosis not present

## 2017-09-06 LAB — CREATININE, SERUM
Creatinine, Ser: 0.85 mg/dL (ref 0.61–1.24)
Creatinine, Ser: 0.85 mg/dL (ref 0.61–1.24)
GFR calc Af Amer: >60 mL/min (ref >60)
GFR calc Af Amer: >60 mL/min (ref >60)
GFR calc non Af Amer: >60 mL/min (ref >60)
GFR calc non Af Amer: >60 mL/min (ref >60)

## 2017-09-06 LAB — CBC
HCT: 33.6 % — ABNORMAL LOW (ref 39.0–52.0)
HEMATOCRIT: 31.1 % — AB (ref 39.0–52.0)
HEMATOCRIT: 34.6 % — AB (ref 39.0–52.0)
HEMOGLOBIN: 11.2 g/dL — AB (ref 13.0–17.0)
Hemoglobin: 10.4 g/dL — ABNORMAL LOW (ref 13.0–17.0)
Hemoglobin: 11 g/dL — ABNORMAL LOW (ref 13.0–17.0)
MCH: 30.2 pg (ref 26.0–34.0)
MCH: 30.1 pg (ref 26.0–34.0)
MCH: 29.9 pg (ref 26.0–34.0)
MCHC: 33.4 g/dL (ref 30.0–36.0)
MCHC: 32.7 g/dL (ref 30.0–36.0)
MCHC: 32.4 g/dL (ref 30.0–36.0)
MCV: 90.4 fL (ref 78.0–100.0)
MCV: 91.8 fL (ref 78.0–100.0)
MCV: 92.5 fL (ref 78.0–100.0)
PLATELETS: 158 10*3/uL (ref 150–400)
Platelets: 169 10*3/uL (ref 150–400)
Platelets: 198 10*3/uL (ref 150–400)
RBC: 3.44 MIL/uL — ABNORMAL LOW (ref 4.22–5.81)
RBC: 3.66 MIL/uL — ABNORMAL LOW (ref 4.22–5.81)
RBC: 3.74 MIL/uL — AB (ref 4.22–5.81)
RDW: 13.1 % (ref 11.5–15.5)
RDW: 13.2 % (ref 11.5–15.5)
RDW: 13.2 % (ref 11.5–15.5)
WBC: 7.8 10*3/uL (ref 4.0–10.5)
WBC: 9.1 10*3/uL (ref 4.0–10.5)
WBC: 10.6 10*3/uL — ABNORMAL HIGH (ref 4.0–10.5)

## 2017-09-06 LAB — GLUCOSE, CAPILLARY
GLUCOSE-CAPILLARY: 103 mg/dL — AB (ref 65–99)
GLUCOSE-CAPILLARY: 107 mg/dL — AB (ref 65–99)
GLUCOSE-CAPILLARY: 116 mg/dL — AB (ref 65–99)
GLUCOSE-CAPILLARY: 123 mg/dL — AB (ref 65–99)
GLUCOSE-CAPILLARY: 130 mg/dL — AB (ref 65–99)
GLUCOSE-CAPILLARY: 145 mg/dL — AB (ref 65–99)
GLUCOSE-CAPILLARY: 161 mg/dL — AB (ref 65–99)
GLUCOSE-CAPILLARY: 163 mg/dL — AB (ref 65–99)
GLUCOSE-CAPILLARY: 99 mg/dL (ref 65–99)
Glucose-Capillary: 150 mg/dL — ABNORMAL HIGH (ref 65–99)
Glucose-Capillary: 101 mg/dL — ABNORMAL HIGH (ref 65–99)
Glucose-Capillary: 86 mg/dL (ref 65–99)
Glucose-Capillary: 109 mg/dL — ABNORMAL HIGH (ref 65–99)
Glucose-Capillary: 97 mg/dL (ref 65–99)
Glucose-Capillary: 138 mg/dL — ABNORMAL HIGH (ref 65–99)
Glucose-Capillary: 116 mg/dL — ABNORMAL HIGH (ref 65–99)
Glucose-Capillary: 112 mg/dL — ABNORMAL HIGH (ref 65–99)
Glucose-Capillary: 146 mg/dL — ABNORMAL HIGH (ref 65–99)

## 2017-09-06 LAB — POCT I-STAT, CHEM 8
BUN: 12 mg/dL (ref 6–20)
CREATININE: 0.8 mg/dL (ref 0.61–1.24)
Calcium, Ion: 1.18 mmol/L (ref 1.15–1.40)
Chloride: 99 mmol/L — ABNORMAL LOW (ref 101–111)
GLUCOSE: 155 mg/dL — AB (ref 65–99)
HCT: 32 % — ABNORMAL LOW (ref 39.0–52.0)
HEMOGLOBIN: 10.9 g/dL — AB (ref 13.0–17.0)
POTASSIUM: 4.4 mmol/L (ref 3.5–5.1)
Sodium: 136 mmol/L (ref 135–145)
TCO2: 25 mmol/L (ref 22–32)

## 2017-09-06 LAB — BASIC METABOLIC PANEL
ANION GAP: 8 (ref 5–15)
BUN: 8 mg/dL (ref 6–20)
CALCIUM: 7.8 mg/dL — AB (ref 8.9–10.3)
CO2: 23 mmol/L (ref 22–32)
CREATININE: 0.73 mg/dL (ref 0.61–1.24)
Chloride: 104 mmol/L (ref 101–111)
GFR calc Af Amer: >60 mL/min (ref >60)
GFR calc non Af Amer: >60 mL/min (ref >60)
GLUCOSE: 112 mg/dL — AB (ref 65–99)
Potassium: 3.8 mmol/L (ref 3.5–5.1)
Sodium: 135 mmol/L (ref 135–145)

## 2017-09-06 LAB — MAGNESIUM
Magnesium: 2 mg/dL (ref 1.7–2.4)
Magnesium: 2 mg/dL (ref 1.7–2.4)

## 2017-09-06 MED ORDER — FUROSEMIDE 10 MG/ML IJ SOLN
20.0000 mg | Freq: Two times a day (BID) | INTRAMUSCULAR | Status: DC
  Administered 2017-09-06 – 2017-09-08 (×5): 20 mg via INTRAVENOUS
  Filled 2017-09-06 (×5): qty 2

## 2017-09-06 MED ORDER — INSULIN DETEMIR 100 UNIT/ML ~~LOC~~ SOLN
10.0000 [IU] | Freq: Two times a day (BID) | SUBCUTANEOUS | Status: DC
  Administered 2017-09-06 – 2017-09-08 (×5): 10 [IU] via SUBCUTANEOUS
  Filled 2017-09-06 (×5): qty 0.1

## 2017-09-06 MED ORDER — ENOXAPARIN SODIUM 40 MG/0.4ML ~~LOC~~ SOLN
40.0000 mg | Freq: Every day | SUBCUTANEOUS | Status: DC
  Administered 2017-09-06 – 2017-09-09 (×4): 40 mg via SUBCUTANEOUS
  Filled 2017-09-06 (×4): qty 0.4

## 2017-09-06 MED ORDER — CARVEDILOL 6.25 MG PO TABS: 6.2500 mg | ORAL_TABLET | Freq: Two times a day (BID) | ORAL | Status: DC

## 2017-09-06 MED ORDER — INSULIN ASPART 100 UNIT/ML ~~LOC~~ SOLN
0.0000 [IU] | SUBCUTANEOUS | Status: DC
  Administered 2017-09-06: 4 [IU] via SUBCUTANEOUS
  Administered 2017-09-06: 2 [IU] via SUBCUTANEOUS

## 2017-09-06 MED ORDER — CARVEDILOL 6.25 MG PO TABS
6.2500 mg | ORAL_TABLET | Freq: Two times a day (BID) | ORAL | Status: DC
  Administered 2017-09-06 – 2017-09-10 (×9): 6.25 mg via ORAL
  Filled 2017-09-06 (×9): qty 1

## 2017-09-06 MED FILL — Heparin Sodium (Porcine) Inj 1000 Unit/ML: INTRAMUSCULAR | Qty: 30 | Status: AC

## 2017-09-06 MED FILL — Magnesium Sulfate Inj 50%: INTRAMUSCULAR | Qty: 10 | Status: AC

## 2017-09-06 MED FILL — Potassium Chloride Inj 2 mEq/ML: INTRAVENOUS | Qty: 40 | Status: AC

## 2017-09-06 NOTE — Discharge Summary (Addendum)
Physician Discharge Summary  Patient ID: Curtis Little MRN: [id number] DOB/AGE: Jan 16, 1951 67 y.o.  Admit date: 09/01/2017 Discharge date: 09/10/2017  Admission Diagnoses:  Patient Active Problem List   Diagnosis Date Noted  . Coronary artery disease 09/05/2017  . Acute systolic CHF (congestive heart failure) (HCC) 09/02/2017  . Coronary artery disease involving native heart without angina pectoris 09/02/2017  . Non-ST elevation (NSTEMI) myocardial infarction [facility]) 09/01/2017   Discharge Diagnoses:   Patient Active Problem List   Diagnosis Date Noted  . S/P CABG x 4 09/05/2017  . Coronary artery disease 09/05/2017  . Acute systolic CHF (congestive heart failure) (HCC) 09/02/2017  . Coronary artery disease involving native heart without angina pectoris 09/02/2017  . Non-ST elevation (NSTEMI) myocardial infarction [facility]) 09/01/2017   Discharged Condition: good  History of Present Illness:  Mr. Curtis Little is a 67 yo male with known PMH of CAD.  He is S/P NSTEMI in 2010 with subsequent PCI to the mid Circumflex and RCA.  He also has history of hyperlipidemia, hypertension, Type 2 DM and nicotine use in the past.  On 09/01/2017 at which time he presented to the ED at an OSH with complaints of shortness of breath, chest tightness, and diaphoresis.  These symptoms initially woke the patient from sleep around midnight.  The patient attempted to get up and move around but did not get any relief.  The symptoms waxed and waned prompting him to be evaluated.  Workup in the ED showed elevated Troponin levels of 0.75 and no acute EKG changes.  His BNP was elevated at 2159. He was ruled in for NSTEI The patient was treated with ASA and started on heparin.  He was transferred to [facility] for further care.        Hospital Course:   Upon arrival to [facility] the patients repeat EKG showed inferior lateral changes.  His Troponin level was at 20.  He continued to have mild dyspnea.  It was felt  they should proceed with cardiac catheterization.  This was done and showed severe 3 vessel disease with severe progression of previous CAD.  It was felt coronary bypass would be indicated and TCTS consult was requested.  The patient was evaluated by Dr. Morton Peters who was in agreement the patient would benefit from bypass surgery.    However, he felt the patient should recover some from his MI.  The risks and benefits of the procedure were explained to the patient and he was agreeable to proceed.  Preoperative workup was completed.  The patient remained chest pain free during his stay.  He was taken to the operating room on 09/05/2017.  He underwent CABG x 4 utilizing LIMA to LAD, SVG to diagonal, SVG to OM1, and SVG to RCA.  He also underwent endoscopic harvest of greater saphenous vein from his right leg.  He tolerated the procedure without difficulty and was taken to the SICU in stable condition.  He was extubated the evening of surgery.  During his stay in the SICU he was weaned off Milrinone drip as tolerated.  His chest tubes and arterial lines were removed without difficulty.  He is an uncontrolled diabetic.  He was weaned off insulin drip and insulin regimens were adjusted to provide good glucose control.  He was maintaining NSR.  He was ambulating independently.  He was transferred to the telemetry unit on 09/08/2016 for further care.  He continued to make good progress.  He continued to maintain NSR without  difficulty.  His pacing wires were removed on 09/09/2017 without issues.  He received diabetic education and importance of good glucose control as his A1c was elevated preoperatively.  He is medically stable for discharge home today.      Significant Diagnostic Studies: angiography:    Ost Cx to Mid Cx lesion is 100% stenosed. There are left to left collaterals.  Mid Cx to Dist Cx lesion is 25% stenosed.  Ost LM to Mid LM lesion is 50% stenosed.  Ost LAD to Prox LAD lesion is 75% stenosed.  Ost  1st Diag to 1st Diag lesion is 75% stenosed.  Prox LAD to Mid LAD lesion is 60% stenosed.  Mid RCA lesion is 25% instent restenosis in the proximal portion of the Taxus stent.  Prox RCA to Mid RCA lesion is 90% stenosed.  Dist RCA lesion is 75% stenosed.  The left ventricular ejection fraction is 35-45% by visual estimate.  There is mild to moderate left ventricular systolic dysfunction.  LV end diastolic pressure is mildly elevated.  There is no aortic valve stenosis.   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  Severe three vessel CAD.  Plan for cardiac surgery referral.   Treatments: surgery:   1. Coronary artery bypass grafting x4 (left internal mammary artery to LAD, saphenous vein graft to diagonal, saphenous vein graft to the OM1, and saphenous vein graft to right coronary artery). 2. Endoscopic harvest of right leg greater saphenous vein.   Discharge Exam: Blood pressure 134/68, pulse 81, temperature 97.8 F (36.6 C), temperature source Oral, resp. rate 18, height 5\' 7"  (1.702 m), weight 181 lb (82.1 kg), SpO2 97 %.    Disposition: Discharge disposition: 01-Home or Self Care     Home  Discharge Medications:   Allergies as of 09/10/2017   No Known Allergies     Medication List    STOP taking these medications   aspirin 325 MG tablet Replaced by:  aspirin 325 MG EC tablet   atenolol 50 MG tablet Commonly known as:  TENORMIN   lovastatin 20 MG tablet Commonly known as:  MEVACOR     TAKE these medications   aspirin 325 MG EC tablet Take 1 tablet (325 mg total) by mouth daily. Replaces:  aspirin 325 MG tablet   atorvastatin 80 MG tablet Commonly known as:  LIPITOR Take 1 tablet (80 mg total) by mouth daily. What changed:    medication strength  how much to take   carvedilol 6.25 MG tablet Commonly known as:  COREG Take 1 tablet (6.25 mg total) by mouth 2 (two) times daily with a meal.   furosemide 40 MG  tablet Commonly known as:  LASIX Take 1 tablet (40 mg total) by mouth daily. For 5 days then stop.   glipiZIDE 5 MG tablet Commonly known as:  GLUCOTROL Take by mouth 2 (two) times daily before a meal.   lisinopril 5 MG tablet Commonly known as:  PRINIVIL,ZESTRIL Take 1 tablet (5 mg total) by mouth daily.   oxyCODONE 5 MG immediate release tablet Commonly known as:  Oxy IR/ROXICODONE Take 5 mg by mouth every 4-6 hours PRN severe pain.   potassium chloride SA 20 MEQ tablet Commonly known as:  K-DUR,KLOR-CON Take 1 tablet (20 mEq total) by mouth daily. For 5 days then stop.   TRESIBA FLEXTOUCH 200 UNIT/ML Sopn Generic drug:  Insulin Degludec Inject 70 Units into the skin daily.      The  patient has been discharged on:   1.Beta Blocker:  Yes [ x  ]                              No   [   ]                              If No, reason:  2.Ace Inhibitor/ARB: Yes [ x  ]                                     No  [    ]                                     If No, reason:  3.Statin:   Yes [ x  ]                  No  [   ]                  If No, reason:  4.Ecasa:  Yes  [ x  ]                  No   [   ]                  If No, reason: Follow-up Information    Kerin Perna, MD Follow up on 10/09/2017.   Specialty:  Cardiothoracic Surgery Why:  Appointment is at 2:00, please get CXR 1:30 at [facility] located on first floor of our office building Contact information: [address] [place] [address] [phone]        [facility]. Schedule an appointment as soon as possible for a visit.   Why:  If you do not have a medical doctor, please establish with one for further diabetes management and surveillance of HGA1C 9.1          Signed: Ardelle Balls PA-C 09/10/2017, 7:38 AM   patient examined and medical record reviewed,agree with above note. Kathlee Nations Trigt III 09/10/2017

## 2017-09-06 NOTE — Progress Notes (Signed)
EVENING ROUNDS NOTE :     [address]       [place] [id number]             [phone]                 1 Day Post-Op Procedure(s) (LRB): CORONARY ARTERY BYPASS GRAFTING (CABG) x four , using left internal mammary artery and right leg greater saphenous vein harvested endoscopically (N/A) TRANSESOPHAGEAL ECHOCARDIOGRAM (TEE) (N/A)  Patient sleeping and not awakened.  Total Length of Stay:  LOS: 5 days  BP 111/61   Pulse 80   Temp 99.3 F (37.4 C) (Oral)   Resp 14   Ht 5\' 7"  (1.702 m)   Wt 185 lb 13.6 oz (84.3 kg)   SpO2 97%   BMI 29.11 kg/m   .Intake/Output      04/05 0701 - 04/06 0700   P.O. 1150   I.V. (mL/kg) 577.2 (6.8)   Blood    IV Piggyback 250   Total Intake(mL/kg) 1977.2 (23.5)   Urine (mL/kg/hr) 1123 (0.9)   Emesis/NG output    Blood    Chest Tube 230 last 12 hours. No air leak  Total Output 1353   Net +624.2         . sodium chloride Stopped (09/06/17 1051)  . sodium chloride Stopped (09/06/17 1051)  .  ceFAZolin (ANCEF) IV Stopped (09/06/17 1729)  . insulin (NOVOLIN-R) infusion Stopped (09/06/17 1321)  . lactated ringers 20 mL/hr at 09/06/17 2100  . lactated ringers 20 mL/hr at 09/06/17 2100  . milrinone 0.25 mcg/kg/min (09/06/17 2100)  . nitroGLYCERIN Stopped (09/05/17 1418)     Lab Results  Component Value Date   WBC 10.6 (H) 09/06/2017   HGB 10.9 (L) 09/06/2017   HCT 32.0 (L) 09/06/2017   PLT 198 09/06/2017   GLUCOSE 155 (H) 09/06/2017   CHOL 167 09/02/2017   TRIG 142 09/02/2017   HDL 34 (L) 09/02/2017   LDLCALC 105 (H) 09/02/2017   ALT 27 09/02/2017   AST 68 (H) 09/02/2017   NA 136 09/06/2017   K 4.4 09/06/2017   CL 99 (L) 09/06/2017   CREATININE 0.80 09/06/2017   BUN 12 09/06/2017   CO2 23 09/06/2017   TSH 2.058 09/02/2017   INR 1.25 09/05/2017   HGBA1C 9.1 (H) 09/02/2017  Continue present management  Doree FudgeDonielle Estefana Taylor PA-C 09/06/2017 10:11 PM

## 2017-09-06 NOTE — Progress Notes (Signed)
Continuing to check in on this patient after his surgery.  Patient doing well, no complaints just working through the process.  Patient and I talked about life today and what it's like to live with less stress.  He and I both hope to find out by practicing living with less stress.  Chaplain appreciative of conversations with him.    09/06/17 1503  Clinical Encounter Type  Visited With Patient  Visit Type Follow-up;Spiritual support;Post-op

## 2017-09-06 NOTE — Progress Notes (Signed)
CVTS family requesting note for patients work .  Curtis HeinzMelissa Garstka, RN

## 2017-09-06 NOTE — Progress Notes (Signed)
1 Day Post-Op Procedure(s) (LRB): CORONARY ARTERY BYPASS GRAFTING (CABG) x four , using left internal mammary artery and right leg greater saphenous vein harvested endoscopically (N/A) TRANSESOPHAGEAL ECHOCARDIOGRAM (TEE) (N/A) Subjective: Doing well after CABGx4 preop MI - L main dz DM A-1-C 9.2  Objective: Vital signs in last 24 hours: Temp:  [96.1 F (35.6 C)-100.4 F (38 C)] 98.1 F (36.7 C) (04/05 0800) Pulse Rate:  [85-105] 89 (04/05 0800) Cardiac Rhythm: Normal sinus rhythm (04/05 0400) Resp:  [12-36] 22 (04/05 0800) BP: (100-119)/(50-77) 117/63 (04/05 0800) SpO2:  [95 %-100 %] 95 % (04/05 0800) Arterial Line BP: (115-162)/(44-61) 143/48 (04/05 0800) FiO2 (%):  [40 %-50 %] 40 % (04/04 1700) Weight:  [185 lb 13.6 oz (84.3 kg)] 185 lb 13.6 oz (84.3 kg) (04/05 0545)  Hemodynamic parameters for last 24 hours: PAP: (15-49)/(6-30) 31/12 CO:  [4.3 L/min-6.5 L/min] 5.7 L/min CI:  [2.3 L/min/m2-3.4 L/min/m2] 3 L/min/m2  Intake/Output from previous day: 04/04 0701 - 04/05 0700 In: 5783.4 [P.O.:240; I.V.:4043.4; Blood:400; IV Piggyback:1100] Out: 4555 [Urine:3485; Blood:500; Chest Tube:570] Intake/Output this shift: No intake/output data recorded.       Exam    General- alert and comfortable    Neck- no JVD, no cervical adenopathy palpable, no carotid bruit   Lungs- clear without rales, wheezes   Cor- regular rate and rhythm, no murmur , gallop   Abdomen- soft, non-tender   Extremities - warm, non-tender, minimal edema   Neuro- oriented, appropriate, no focal weakness   Lab Results: Recent Labs    09/05/17 1921 09/06/17 0300  WBC 10.9* 7.8  HGB 12.0* 10.4*  HCT 35.7* 31.1*  PLT 187 158   BMET:  Recent Labs    09/05/17 0428  09/05/17 1909 09/05/17 1917 09/06/17 0300  NA 135   < > 140  --  135  K 4.2   < > 4.5  --  3.8  CL 102   < > 105  --  104  CO2 25  --   --   --  23  GLUCOSE 179*   < > 125*  --  112*  BUN 14   < > 11  --  8  CREATININE 0.79   < >  0.60* 0.76 0.73  CALCIUM 8.9  --   --   --  7.8*   < > = values in this interval not displayed.    PT/INR:  Recent Labs    09/05/17 1338  LABPROT 15.6*  INR 1.25   ABG    Component Value Date/Time   PHART 7.327 (L) 09/05/2017 1731   HCO3 22.5 09/05/2017 1731   TCO2 21 (L) 09/05/2017 1909   ACIDBASEDEF 3.0 (H) 09/05/2017 1731   O2SAT 96.0 09/05/2017 1731   CBG (last 3)  Recent Labs    09/06/17 0409 09/06/17 0453 09/06/17 0742  GLUCAP 101* 86 97    Assessment/Plan: S/P Procedure(s) (LRB): CORONARY ARTERY BYPASS GRAFTING (CABG) x four , using left internal mammary artery and right leg greater saphenous vein harvested endoscopically (N/A) TRANSESOPHAGEAL ECHOCARDIOGRAM (TEE) (N/A) Mobilize Diuresis Diabetes control See progression orders coreg for beta blocker , add low dose ace inhibitor prior to DC   LOS: 5 days    Kathlee Nationseter Van Trigt III 09/06/2017

## 2017-09-07 ENCOUNTER — Inpatient Hospital Stay (HOSPITAL_COMMUNITY): Payer: BC Managed Care – PPO

## 2017-09-07 LAB — BASIC METABOLIC PANEL
Anion gap: 9 (ref 5–15)
BUN: 11 mg/dL (ref 6–20)
CO2: 25 mmol/L (ref 22–32)
Calcium: 8.2 mg/dL — ABNORMAL LOW (ref 8.9–10.3)
Chloride: 100 mmol/L — ABNORMAL LOW (ref 101–111)
Creatinine, Ser: 0.84 mg/dL (ref 0.61–1.24)
GFR calc Af Amer: >60 mL/min (ref >60)
GFR calc non Af Amer: >60 mL/min (ref >60)
Glucose, Bld: 122 mg/dL — ABNORMAL HIGH (ref 65–99)
Potassium: 4.1 mmol/L (ref 3.5–5.1)
Sodium: 134 mmol/L — ABNORMAL LOW (ref 135–145)

## 2017-09-07 LAB — CBC
HCT: 32.5 % — ABNORMAL LOW (ref 39.0–52.0)
Hemoglobin: 11.1 g/dL — ABNORMAL LOW (ref 13.0–17.0)
MCH: 31.6 pg (ref 26.0–34.0)
MCHC: 34.2 g/dL (ref 30.0–36.0)
MCV: 92.6 fL (ref 78.0–100.0)
Platelets: 178 10*3/uL (ref 150–400)
RBC: 3.51 MIL/uL — ABNORMAL LOW (ref 4.22–5.81)
RDW: 13.6 % (ref 11.5–15.5)
WBC: 10.3 10*3/uL (ref 4.0–10.5)

## 2017-09-07 LAB — GLUCOSE, CAPILLARY
GLUCOSE-CAPILLARY: 115 mg/dL — AB (ref 65–99)
GLUCOSE-CAPILLARY: 157 mg/dL — AB (ref 65–99)
Glucose-Capillary: 105 mg/dL — ABNORMAL HIGH (ref 65–99)
Glucose-Capillary: 127 mg/dL — ABNORMAL HIGH (ref 65–99)
Glucose-Capillary: 135 mg/dL — ABNORMAL HIGH (ref 65–99)

## 2017-09-07 MED ORDER — INSULIN ASPART 100 UNIT/ML ~~LOC~~ SOLN
0.0000 [IU] | Freq: Three times a day (TID) | SUBCUTANEOUS | Status: DC
Start: 2017-09-07 — End: 2017-09-10
  Administered 2017-09-07: 3 [IU] via SUBCUTANEOUS
  Administered 2017-09-07 – 2017-09-09 (×5): 2 [IU] via SUBCUTANEOUS
  Administered 2017-09-10: 3 [IU] via SUBCUTANEOUS

## 2017-09-07 MED ORDER — INSULIN ASPART 100 UNIT/ML ~~LOC~~ SOLN: 0.0000 [IU] | Freq: Every day | SUBCUTANEOUS | Status: DC

## 2017-09-07 MED ORDER — ORAL CARE MOUTH RINSE
15.0000 mL | Freq: Two times a day (BID) | OROMUCOSAL | Status: DC
  Administered 2017-09-07: 15 mL via OROMUCOSAL

## 2017-09-07 NOTE — Progress Notes (Signed)
2 Days Post-Op Procedure(s) (LRB): CORONARY ARTERY BYPASS GRAFTING (CABG) x four , using left internal mammary artery and right leg greater saphenous vein harvested endoscopically (N/A) TRANSESOPHAGEAL ECHOCARDIOGRAM (TEE) (N/A) Subjective: No complaints this AM  Objective: Vital signs in last 24 hours: Temp:  [97.8 F (36.6 C)-99.3 F (37.4 C)] 97.8 F (36.6 C) (04/06 0753) Pulse Rate:  [80-91] 88 (04/06 0800) Cardiac Rhythm: Normal sinus rhythm (04/06 0800) Resp:  [11-31] 12 (04/06 0800) BP: (100-157)/(60-72) 132/63 (04/06 0800) SpO2:  [91 %-99 %] 97 % (04/06 0800) Weight:  [184 lb 4.9 oz (83.6 kg)] 184 lb 4.9 oz (83.6 kg) (04/06 0500)  Hemodynamic parameters for last 24 hours:    Intake/Output from previous day: 04/05 0701 - 04/06 0700 In: 2457.2 [P.O.:1150; I.V.:857.2; IV Piggyback:450] Out: 4098 [[id number] [Urine:1473; Chest Tube:300] Intake/Output this shift: Total I/O In: 26 [I.V.:26] Out: 70 [Urine:50; Chest Tube:20]  General appearance: alert, cooperative and no distress Neurologic: intact Heart: regular rate and rhythm Lungs: diminished breath sounds bibasilar Abdomen: mildly distended, + BS  Lab Results: Recent Labs    09/06/17 1806 09/06/17 1807 09/07/17 0356  WBC 10.6*  --  10.3  HGB 11.2* 10.9* 11.1*  HCT 34.6* 32.0* 32.5*  PLT 198  --  178   BMET:  Recent Labs    09/06/17 0300  09/06/17 1807 09/07/17 0356  NA 135  --  136 134*  K 3.8  --  4.4 4.1  CL 104  --  99* 100*  CO2 23  --   --  25  GLUCOSE 112*  --  155* 122*  BUN 8  --  12 11  CREATININE 0.73   < > 0.80 0.84  CALCIUM 7.8*  --   --  8.2*   < > = values in this interval not displayed.    PT/INR:  Recent Labs    09/05/17 1338  LABPROT 15.6*  INR 1.25   ABG    Component Value Date/Time   PHART 7.327 (L) 09/05/2017 1731   HCO3 22.5 09/05/2017 1731   TCO2 25 09/06/2017 1807   ACIDBASEDEF 3.0 (H) 09/05/2017 1731   O2SAT 96.0 09/05/2017 1731   CBG (last 3)  Recent Labs     09/06/17 1943 09/06/17 2340 09/07/17 0339  GLUCAP 146* 103* 105*    Assessment/Plan: S/P Procedure(s) (LRB): CORONARY ARTERY BYPASS GRAFTING (CABG) x four , using left internal mammary artery and right leg greater saphenous vein harvested endoscopically (N/A) TRANSESOPHAGEAL ECHOCARDIOGRAM (TEE) (N/A) POD # 2  CV- stable in SR- will decrease milrinone to 0.125  RESP- bibasilar atelectasis- continue IS  RENAL - creatinine and lytes OK- continue IV diuresis  ENDO- CBG well controlled- change CBG to AC and HS  SCD + enoxaparin for DVT prophylaxis  Cardiac rehab  dc chest tubes   LOS: 6 days    Curtis SlotSteven C Little 09/07/2017

## 2017-09-07 NOTE — Progress Notes (Signed)
      [address]       [place] [id number]             [phone]      No complaints  BP 124/67   Pulse 81   Temp 98.1 F (36.7 C) (Oral)   Resp 12   Ht 5\' 7"  (1.702 m)   Wt 184 lb 4.9 oz (83.6 kg)   SpO2 95%   BMI 28.87 kg/m    Intake/Output Summary (Last 24 hours) at 09/07/2017 1853 Last data filed at 09/07/2017 1800 Gross per 24 hour  Intake 993.75 ml  Output 1400 ml  Net -406.25 ml   Continue present care  Viviann SpareSteven C. Dorris FetchHendrickson, MD [facility] [phone]

## 2017-09-08 ENCOUNTER — Inpatient Hospital Stay (HOSPITAL_COMMUNITY): Payer: BC Managed Care – PPO

## 2017-09-08 LAB — BASIC METABOLIC PANEL
ANION GAP: 10 (ref 5–15)
BUN: 15 mg/dL (ref 6–20)
CHLORIDE: 99 mmol/L — AB (ref 101–111)
CO2: 27 mmol/L (ref 22–32)
CREATININE: 0.75 mg/dL (ref 0.61–1.24)
Calcium: 8.3 mg/dL — ABNORMAL LOW (ref 8.9–10.3)
GFR calc non Af Amer: >60 mL/min (ref >60)
Glucose, Bld: 104 mg/dL — ABNORMAL HIGH (ref 65–99)
POTASSIUM: 3.5 mmol/L (ref 3.5–5.1)
SODIUM: 136 mmol/L (ref 135–145)

## 2017-09-08 LAB — TYPE AND SCREEN
ABO/RH(D): A POS
Antibody Screen: NEGATIVE
Unit division: 0
Unit division: 0

## 2017-09-08 LAB — BPAM RBC
Blood Product Expiration Date: 201904252359
Blood Product Expiration Date: 201904252359
Unit Type and Rh: 6200
Unit Type and Rh: 6200

## 2017-09-08 LAB — GLUCOSE, CAPILLARY
GLUCOSE-CAPILLARY: 140 mg/dL — AB (ref 65–99)
Glucose-Capillary: 89 mg/dL (ref 65–99)
Glucose-Capillary: 137 mg/dL — ABNORMAL HIGH (ref 65–99)
Glucose-Capillary: 133 mg/dL — ABNORMAL HIGH (ref 65–99)

## 2017-09-08 LAB — CBC
HEMATOCRIT: 30.6 % — AB (ref 39.0–52.0)
HEMOGLOBIN: 10.3 g/dL — AB (ref 13.0–17.0)
MCH: 31 pg (ref 26.0–34.0)
MCHC: 33.7 g/dL (ref 30.0–36.0)
MCV: 92.2 fL (ref 78.0–100.0)
Platelets: 192 10*3/uL (ref 150–400)
RBC: 3.32 MIL/uL — AB (ref 4.22–5.81)
RDW: 13.3 % (ref 11.5–15.5)
WBC: 7.8 10*3/uL (ref 4.0–10.5)

## 2017-09-08 MED ORDER — SODIUM CHLORIDE 0.9% FLUSH: 3.0000 mL | INTRAVENOUS | Status: DC | PRN

## 2017-09-08 MED ORDER — MOVING RIGHT ALONG BOOK
Freq: Once | Status: DC
  Filled 2017-09-08: qty 1

## 2017-09-08 MED ORDER — GLIPIZIDE 5 MG PO TABS
5.0000 mg | ORAL_TABLET | Freq: Two times a day (BID) | ORAL | Status: DC
  Administered 2017-09-08 – 2017-09-10 (×4): 5 mg via ORAL
  Filled 2017-09-08 (×5): qty 1

## 2017-09-08 MED ORDER — FUROSEMIDE 40 MG PO TABS
40.0000 mg | ORAL_TABLET | Freq: Every day | ORAL | Status: DC
  Administered 2017-09-09: 40 mg via ORAL
  Filled 2017-09-08: qty 1

## 2017-09-08 MED ORDER — SODIUM CHLORIDE 0.9 % IV SOLN: 250.0000 mL | INTRAVENOUS | Status: DC | PRN

## 2017-09-08 MED ORDER — SODIUM CHLORIDE 0.9% FLUSH
3.0000 mL | Freq: Two times a day (BID) | INTRAVENOUS | Status: DC
  Administered 2017-09-09: 3 mL via INTRAVENOUS

## 2017-09-08 NOTE — Progress Notes (Signed)
Patient transferring to 4E room 18. Report given to Rohm and HaasJames RN. All personal belongings accounted for and friend present at this time.

## 2017-09-08 NOTE — Progress Notes (Signed)
3 Days Post-Op Procedure(s) (LRB): CORONARY ARTERY BYPASS GRAFTING (CABG) x four , using left internal mammary artery and right leg greater saphenous vein harvested endoscopically (N/A) TRANSESOPHAGEAL ECHOCARDIOGRAM (TEE) (N/A) Subjective: No complaints, "when can I go home"  Objective: Vital signs in last 24 hours: Temp:  [97.7 F (36.5 C)-98.1 F (36.7 C)] 97.7 F (36.5 C) (04/07 0815) Pulse Rate:  [78-95] 95 (04/07 0900) Cardiac Rhythm: Normal sinus rhythm (04/07 0735) Resp:  [9-25] 22 (04/07 0900) BP: (108-163)/(62-84) 149/70 (04/07 0900) SpO2:  [91 %-100 %] 100 % (04/07 0900) Weight:  [183 lb 12.8 oz (83.4 kg)] 183 lb 12.8 oz (83.4 kg) (04/07 0600)  Hemodynamic parameters for last 24 hours:    Intake/Output from previous day: 04/06 0701 - 04/07 0700 In: 1401.8 [P.O.:840; I.V.:561.8] Out: 1320 [Urine:1300; Chest Tube:20] Intake/Output this shift: Total I/O In: 46 [I.V.:46] Out: 575 [Urine:575]  General appearance: alert, cooperative and no distress Neurologic: intact Heart: regular rate and rhythm Lungs: diminished breath sounds bibasilar Abdomen: mildly distended, + BS, nontender  Lab Results: Recent Labs    09/07/17 0356 09/08/17 0531  WBC 10.3 7.8  HGB 11.1* 10.3*  HCT 32.5* 30.6*  PLT 178 192   BMET:  Recent Labs    09/07/17 0356 09/08/17 0531  NA 134* 136  K 4.1 3.5  CL 100* 99*  CO2 25 27  GLUCOSE 122* 104*  BUN 11 15  CREATININE 0.84 0.75  CALCIUM 8.2* 8.3*    PT/INR:  Recent Labs    09/05/17 1338  LABPROT 15.6*  INR 1.25   ABG    Component Value Date/Time   PHART 7.327 (L) 09/05/2017 1731   HCO3 22.5 09/05/2017 1731   TCO2 25 09/06/2017 1807   ACIDBASEDEF 3.0 (H) 09/05/2017 1731   O2SAT 96.0 09/05/2017 1731   CBG (last 3)  Recent Labs    09/07/17 1546 09/07/17 2121 09/08/17 0813  GLUCAP 157* 135* 89    Assessment/Plan: S/P Procedure(s) (LRB): CORONARY ARTERY BYPASS GRAFTING (CABG) x four , using left internal mammary  artery and right leg greater saphenous vein harvested endoscopically (N/A) TRANSESOPHAGEAL ECHOCARDIOGRAM (TEE) (N/A) Plan for transfer to step-down: see transfer orders  CV- stable- will dc milrinone  Continue ASA, coreg, statin  RESP- continue IS  RENAL- creatinine stable- supplement K  ENDO- CBG well controlled- dc levemir, restart glipizide  Continue cardiac rehab   LOS: 7 days    Curtis Little 09/08/2017

## 2017-09-09 ENCOUNTER — Inpatient Hospital Stay (HOSPITAL_COMMUNITY): Payer: BC Managed Care – PPO

## 2017-09-09 DIAGNOSIS — I25110 Atherosclerotic heart disease of native coronary artery with unstable angina pectoris: Secondary | ICD-10-CM

## 2017-09-09 LAB — GLUCOSE, CAPILLARY
GLUCOSE-CAPILLARY: 133 mg/dL — AB (ref 65–99)
GLUCOSE-CAPILLARY: 137 mg/dL — AB (ref 65–99)
GLUCOSE-CAPILLARY: 191 mg/dL — AB (ref 65–99)
Glucose-Capillary: 140 mg/dL — ABNORMAL HIGH (ref 65–99)

## 2017-09-09 LAB — BASIC METABOLIC PANEL
Anion gap: 11 (ref 5–15)
BUN: 14 mg/dL (ref 6–20)
CALCIUM: 8.4 mg/dL — AB (ref 8.9–10.3)
CO2: 27 mmol/L (ref 22–32)
Chloride: 97 mmol/L — ABNORMAL LOW (ref 101–111)
Creatinine, Ser: 0.77 mg/dL (ref 0.61–1.24)
GFR calc Af Amer: >60 mL/min (ref >60)
Glucose, Bld: 107 mg/dL — ABNORMAL HIGH (ref 65–99)
POTASSIUM: 3.6 mmol/L (ref 3.5–5.1)
SODIUM: 135 mmol/L (ref 135–145)

## 2017-09-09 LAB — CBC
HEMATOCRIT: 32.9 % — AB (ref 39.0–52.0)
Hemoglobin: 11 g/dL — ABNORMAL LOW (ref 13.0–17.0)
MCH: 30.6 pg (ref 26.0–34.0)
MCHC: 33.4 g/dL (ref 30.0–36.0)
MCV: 91.4 fL (ref 78.0–100.0)
PLATELETS: 261 10*3/uL (ref 150–400)
RBC: 3.6 MIL/uL — ABNORMAL LOW (ref 4.22–5.81)
RDW: 12.7 % (ref 11.5–15.5)
WBC: 8.6 10*3/uL (ref 4.0–10.5)

## 2017-09-09 MED ORDER — LISINOPRIL 5 MG PO TABS
5.0000 mg | ORAL_TABLET | Freq: Every day | ORAL | Status: DC
  Administered 2017-09-09 – 2017-09-10 (×2): 5 mg via ORAL
  Filled 2017-09-09 (×2): qty 1

## 2017-09-09 MED ORDER — POTASSIUM CHLORIDE CRYS ER 20 MEQ PO TBCR
20.0000 meq | EXTENDED_RELEASE_TABLET | Freq: Every day | ORAL | Status: DC
  Administered 2017-09-09: 20 meq via ORAL
  Filled 2017-09-09: qty 1

## 2017-09-09 NOTE — Progress Notes (Signed)
EPW removed, clean and intact. Pt educated on bedrest. VSS. Call light in reach. Will continue to monitor.  Versie StarksHanna  Lilu Mcglown, RN

## 2017-09-09 NOTE — Progress Notes (Addendum)
      [address]       [place] [id number]             [phone]      4 Days Post-Op Procedure(s) (LRB): CORONARY ARTERY BYPASS GRAFTING (CABG) x four , using left internal mammary artery and right leg greater saphenous vein harvested endoscopically (N/A) TRANSESOPHAGEAL ECHOCARDIOGRAM (TEE) (N/A)   Subjective:  Mr. Ammie DaltonBolick has no new complaints.  He states he is feeling pretty good.  Notices he is still swollen in his hands and legs.  Objective: Vital signs in last 24 hours: Temp:  [97.7 F (36.5 C)-98.3 F (36.8 C)] 98 F (36.7 C) (04/08 0809) Pulse Rate:  [83-95] 83 (04/07 1305) Cardiac Rhythm: Normal sinus rhythm (04/08 0700) Resp:  [18-27] 26 (04/08 0809) BP: (127-149)/(54-75) 127/65 (04/08 0809) SpO2:  [96 %-100 %] 98 % (04/08 0809) Weight:  [181 lb 14.4 oz (82.5 kg)] 181 lb 14.4 oz (82.5 kg) (04/08 0413)  Intake/Output from previous day: 04/07 0701 - 04/08 0700 In: 309 [P.O.:240; I.V.:69] Out: 825 [Urine:825]  General appearance: alert, cooperative and no distress Heart: regular rate and rhythm Lungs: clear to auscultation bilaterally Abdomen: soft, non-tender; bowel sounds normal; no masses,  no organomegaly Extremities: edema trace Wound: clean and dry  Lab Results: Recent Labs    09/08/17 0531 09/09/17 0306  WBC 7.8 8.6  HGB 10.3* 11.0*  HCT 30.6* 32.9*  PLT 192 261   BMET:  Recent Labs    09/08/17 0531 09/09/17 0306  NA 136 135  K 3.5 3.6  CL 99* 97*  CO2 27 27  GLUCOSE 104* 107*  BUN 15 14  CREATININE 0.75 0.77  CALCIUM 8.3* 8.4*    PT/INR: No results for input(s): LABPROT, INR in the last 72 hours. ABG    Component Value Date/Time   PHART 7.327 (L) 09/05/2017 1731   HCO3 22.5 09/05/2017 1731   TCO2 25 09/06/2017 1807   ACIDBASEDEF 3.0 (H) 09/05/2017 1731   O2SAT 96.0 09/05/2017 1731   CBG (last 3)  Recent Labs    09/08/17 1218 09/08/17 1555 09/08/17 2142  GLUCAP 140* 137* 133*    Assessment/Plan: S/P  Procedure(s) (LRB): CORONARY ARTERY BYPASS GRAFTING (CABG) x four , using left internal mammary artery and right leg greater saphenous vein harvested endoscopically (N/A) TRANSESOPHAGEAL ECHOCARDIOGRAM (TEE) (N/A)  1. CV- NSR, BP is little elevated- will continue Coreg, start low dose Lisinopril.. Will d/c EPW today 2. Pulm- no acute issues, continue IS, CXR is not yet completed 3. Renal- creatinine WNL, weight is trending down, continue Lasix, will add potassium supplementation 4. Expected blood loss anemia, Hgb stable at 11.0 5. DM- sugars controlled, A1c preop was >9, continue current regimen, consult to diabetes coordinator 6. Dispo- patient stable, will d/c EPW today, start low dose ACE for mild HTN, if remains stable will d/c home in AM   LOS: 8 days    Erin Barrett patient examined and medical record reviewed,agree with above note. Kathlee Nationseter Van Trigt III 09/09/2017

## 2017-09-09 NOTE — Progress Notes (Signed)
CARDIAC REHAB PHASE I   PRE:  Rate/Rhythm: 88 SR  BP:  Supine:   Sitting: 124/83  Standing:    SaO2: 96%RA  MODE:  Ambulation: 700 ft   POST:  Rate/Rhythm: 108 ST  BP:  Supine:   Sitting: 145/69  Standing:    SaO2: 94%RA [id number] Pt walked 700 ft on RA with hand held asst with steady gait. Tolerated well. Motivated to walk. To recliner with call bell.    Luetta Nuttingharlene Tyshaun Vinzant, RN BSN  09/09/2017 8:36 AM

## 2017-09-09 NOTE — Anesthesia Postprocedure Evaluation (Addendum)
Anesthesia Post Note  Patient: Harless NakayamaDanny W [facility]  Procedure(s) Performed: CORONARY ARTERY BYPASS GRAFTING (CABG) x four , using left internal mammary artery and right leg greater saphenous vein harvested endoscopically (N/A Chest) TRANSESOPHAGEAL ECHOCARDIOGRAM (TEE) (N/A )     Patient location during evaluation: SICU Anesthesia Type: General Level of consciousness: awake Pain management: pain level controlled Vital Signs Assessment: post-procedure vital signs reviewed and stable Respiratory status: spontaneous breathing Cardiovascular status: stable Postop Assessment: no apparent nausea or vomiting Anesthetic complications: no    Last Vitals:  Vitals:   09/09/17 1224 09/09/17 1658  BP: 129/63 125/62  Pulse:  82  Resp: (!) 22   Temp: 36.8 C   SpO2:      Last Pain:  Vitals:   09/09/17 1257  TempSrc:   PainSc: Asleep   Pain Goal: Patients Stated Pain Goal: 2 (09/08/17 1100)               Shelton SilvasKevin D Hollis

## 2017-09-09 NOTE — Progress Notes (Signed)
Inpatient Diabetes Program Recommendations  AACE/ADA: New Consensus Statement on Inpatient Glycemic Control (2015)  Target Ranges:  Prepandial:   less than 140 mg/dL      Peak postprandial:   less than 180 mg/dL (1-2 hours)      Critically ill patients:  140 - 180 mg/dL   Spoke with patient about diabetes and home regimen for diabetes control. Patient reports that he is followed by a doctor in [place] for DM control. Patient last saw his doctor 2 weeks ago and was told to increase tresiba 3 units every day until fasting glucose was below 130 mg/dl. Patient reports A1c of 9% at that time. Patient reports he was eating a lot and eating what he shouldn't. He does report drinking diet beverages, unsweetened tea, and water. Asked patient what his diet looked like moving forward and what changes he would make. Patient reports they ate out a lot and ate a lot of hamburgers and hotdogs and fried bologna. Patient reports they will cook their meals and he would watch his portion sizes.   Spoke with patient about his current regimen now for glucose control inpatient and to be cautious about his home dose of basal insulin. Spoke with patient to check his glucose levels more often to determine if he is running higher other parts of the day that he does not know about normally.  Discussed A1c and glucose goals. Discussed importance of checking CBGs and maintaining good CBG control to prevent long-term and short-term complications. Explained how hyperglycemia leads to damage within blood vessels which lead to the common complications seen with uncontrolled diabetes. Patient verbalized understanding of information discussed and he states that he has no further questions at this time related to diabetes.   Thanks,  Christena DeemShannon Malesha Suliman RN, MSN, [facility] Inpatient Diabetes Coordinator Team Pager [phone] (8a-5p)

## 2017-09-10 LAB — BASIC METABOLIC PANEL
Anion gap: 8 (ref 5–15)
BUN: 15 mg/dL (ref 6–20)
CO2: 27 mmol/L (ref 22–32)
Calcium: 8.3 mg/dL — ABNORMAL LOW (ref 8.9–10.3)
Chloride: 101 mmol/L (ref 101–111)
Creatinine, Ser: 0.76 mg/dL (ref 0.61–1.24)
GFR calc Af Amer: >60 mL/min (ref >60)
GFR calc non Af Amer: >60 mL/min (ref >60)
Glucose, Bld: 150 mg/dL — ABNORMAL HIGH (ref 65–99)
Potassium: 3.7 mmol/L (ref 3.5–5.1)
Sodium: 136 mmol/L (ref 135–145)

## 2017-09-10 LAB — GLUCOSE, CAPILLARY: GLUCOSE-CAPILLARY: 164 mg/dL — AB (ref 65–99)

## 2017-09-10 MED ORDER — POTASSIUM CHLORIDE CRYS ER 20 MEQ PO TBCR: 20.0000 meq | EXTENDED_RELEASE_TABLET | Freq: Every day | ORAL | 0 refills | Status: DC

## 2017-09-10 MED ORDER — LISINOPRIL 5 MG PO TABS: 5.0000 mg | ORAL_TABLET | Freq: Every day | ORAL | 1 refills | Status: DC

## 2017-09-10 MED ORDER — FUROSEMIDE 40 MG PO TABS: 40.0000 mg | ORAL_TABLET | Freq: Every day | ORAL | 0 refills | Status: DC

## 2017-09-10 MED ORDER — ASPIRIN 325 MG PO TBEC: 325.0000 mg | DELAYED_RELEASE_TABLET | Freq: Every day | ORAL | 0 refills | Status: DC

## 2017-09-10 MED ORDER — POTASSIUM CHLORIDE CRYS ER 10 MEQ PO TBCR
30.0000 meq | EXTENDED_RELEASE_TABLET | Freq: Once | ORAL | Status: AC
  Administered 2017-09-10: 08:00:00 30 meq via ORAL
  Filled 2017-09-10: qty 1

## 2017-09-10 MED ORDER — FUROSEMIDE 10 MG/ML IJ SOLN
40.0000 mg | Freq: Once | INTRAMUSCULAR | Status: AC
  Administered 2017-09-10: 40 mg via INTRAVENOUS
  Filled 2017-09-10: qty 4

## 2017-09-10 MED ORDER — ATORVASTATIN CALCIUM 80 MG PO TABS: 80.0000 mg | ORAL_TABLET | Freq: Every day | ORAL | 1 refills | Status: DC

## 2017-09-10 MED ORDER — OXYCODONE HCL 5 MG PO TABS: ORAL_TABLET | ORAL | 0 refills | Status: DC

## 2017-09-10 MED ORDER — CARVEDILOL 6.25 MG PO TABS: 6.2500 mg | ORAL_TABLET | Freq: Two times a day (BID) | ORAL | 1 refills | Status: DC

## 2017-09-10 MED FILL — Mannitol IV Soln 20%: INTRAVENOUS | Qty: 500 | Status: AC

## 2017-09-10 MED FILL — Sodium Bicarbonate IV Soln 8.4%: INTRAVENOUS | Qty: 50 | Status: AC

## 2017-09-10 MED FILL — Heparin Sodium (Porcine) Inj 1000 Unit/ML: INTRAMUSCULAR | Qty: 30 | Status: AC

## 2017-09-10 MED FILL — Electrolyte-R (PH 7.4) Solution: INTRAVENOUS | Qty: 3000 | Status: AC

## 2017-09-10 MED FILL — Lidocaine HCl IV Inj 20 MG/ML: INTRAVENOUS | Qty: 10 | Status: AC

## 2017-09-10 MED FILL — Sodium Chloride IV Soln 0.9%: INTRAVENOUS | Qty: 2000 | Status: AC

## 2017-09-10 NOTE — Progress Notes (Signed)
[id number] Education completed with pt who voiced understanding. Stressed importance of IS and sternal precautions and staying in the tube. Gave pt diabetic and heart healthy diets and discussed carb counting. Discussed ex ed and CRP 2. Referred to [place] CRP 2.  Luetta NuttingCharlene Aryan Sparks RN BSN 09/10/2017 9:54 AM

## 2017-09-10 NOTE — Discharge Instructions (Signed)
Coronary Artery Bypass Grafting, Care After °This sheet gives you information about how to care for yourself after your procedure. Your health care provider may also give you more specific instructions. If you have problems or questions, contact your health care provider. °What can I expect after the procedure? °After the procedure, it is common to have: °· Nausea and a lack of appetite. °· Constipation. °· Weakness and fatigue. °· Depression or irritability. °· Pain or discomfort in your incision areas. ° °Follow these instructions at home: °Medicines °· Take over-the-counter and prescription medicines only as told by your health care provider. Do not stop taking medicines or start any new medicines without approval from your health care provider. °· If you were prescribed an antibiotic medicine, take it as told by your health care provider. Do not stop taking the antibiotic even if you start to feel better. °· Do not drive or use heavy machinery while taking prescription pain medicine. °Incision care °· Follow instructions from your health care provider about how to take care of your incisions. Make sure you: °? Wash your hands with soap and water before you change your bandage (dressing). If soap and water are not available, use hand sanitizer. °? Change your dressing as told by your health care provider. °? Leave stitches (sutures), skin glue, or adhesive strips in place. These skin closures may need to stay in place for 2 weeks or longer. If adhesive strip edges start to loosen and curl up, you may trim the loose edges. Do not remove adhesive strips completely unless your health care provider tells you to do that. °· Keep incision areas clean, dry, and protected. °· Check your incision areas every day for signs of infection. Check for: °? More redness, swelling, or pain. °? More fluid or blood. °? Warmth. °? Pus or a bad smell. °· If incisions were made in your legs: °? Avoid crossing your legs. °? Avoid  sitting for long periods of time. Change positions every 30 minutes. °? Raise (elevate) your legs when you are sitting. °Bathing °· Do not take baths, swim, or use a hot tub until your health care provider approves. °· Only take sponge baths. Pat the incisions dry. Do not rub incisions with a washcloth or towel. °· Ask your health care provider when you can shower. °Eating and drinking °· Eat foods that are high in fiber, such as raw fruits and vegetables, whole grains, beans, and nuts. Meats should be lean cut. Avoid canned, processed, and fried foods. This can help prevent constipation and is a recommended part of a heart-healthy diet. °· Drink enough fluid to keep your urine clear or pale yellow. °· Limit alcohol intake to no more than 1 drink a day for nonpregnant women and 2 drinks a day for men. One drink equals 12 oz of beer, 5 oz of wine, or 1½ oz of hard liquor. °Activity °· Rest and limit your activity as told by your health care provider. You may be instructed to: °? Stop any activity right away if you have chest pain, shortness of breath, irregular heartbeats, or dizziness. Get help right away if you have any of these symptoms. °? Move around frequently for short periods or take short walks as directed by your health care provider. Gradually increase your activities. You may need physical therapy or cardiac rehabilitation to help strengthen your muscles and build your endurance. °? Avoid lifting, pushing, or pulling anything that is heavier than 10 lb (4.5 kg) for at   least 6 weeks or as told by your health care provider.  Do not drive until your health care provider approves.  Ask your health care provider when you may return to work.  Ask your health care provider when you may resume sexual activity. General instructions  Do not use any products that contain nicotine or tobacco, such as cigarettes and e-cigarettes. If you need help quitting, ask your health care provider.  Take 2-3 deep  breaths every few hours during the day, while you recover. This helps expand your lungs and prevent complications like pneumonia after surgery.  If you were given a device called an incentive spirometer, use it several times a day to practice deep breathing. Support your chest with a pillow or your arms when you take deep breaths or cough.  Wear compression stockings as told by your health care provider. These stockings help to prevent blood clots and reduce swelling in your legs.  Weigh yourself every day. This helps identify if your body is holding (retaining) fluid that may make your heart and lungs work harder.  Keep all follow-up visits as told by your health care provider. This is important. Contact a health care provider if:  You have more redness, swelling, or pain around any incision.  You have more fluid or blood coming from any incision.  Any incision feels warm to the touch.  You have pus or a bad smell coming from any incision  You have a fever.  You have swelling in your ankles or legs.  You have pain in your legs.  You gain 2 lb (0.9 kg) or more a day.  You are nauseous or you vomit.  You have diarrhea. Get help right away if:  You have chest pain that spreads to your jaw or arms.  You are short of breath.  You have a fast or irregular heartbeat.  You notice a "clicking" in your breastbone (sternum) when you move.  You have numbness or weakness in your arms or legs.  You feel dizzy or light-headed. Summary  After the procedure, it is common to have pain or discomfort in the incision areas.  Do not take baths, swim, or use a hot tub until your health care provider approves.  Gradually increase your activities. You may need physical therapy or cardiac rehabilitation to help strengthen your muscles and build your endurance.  Weigh yourself every day. This helps identify if your body is holding (retaining) fluid that may make your heart and lungs work  harder. This information is not intended to replace advice given to you by your health care provider. Make sure you discuss any questions you have with your health care provider. Document Released: 12/08/2004 Document Revised: 04/09/2016 Document Reviewed: 04/09/2016 Elsevier Interactive Patient Education  2018 [employer]. Discharge Instructions:  1. You may shower, please wash incisions daily with soap and water and keep dry.  If you wish to cover wounds with dressing you may do so but please keep clean and change daily.  No tub baths or swimming until incisions have completely healed.  If your incisions become red or develop any drainage please call our office at [phone]  2. No Driving until cleared by Dr. Zenaida Niece Trigt's office and you are no longer using narcotic pain medications  3. Monitor your weight daily.. Please use the same scale and weigh at same time... If you gain 5-10 lbs in 48 hours with associated lower extremity swelling, please contact our office at [phone]  4.  Fever of 101.5 for at least 24 hours with no source, please contact our office at [phone]  5. Activity- up as tolerated, please walk at least 3 times per day.  Avoid strenuous activity, no lifting, pushing, or pulling with your arms over 8-10 lbs for a minimum of 6 weeks  6. If any questions or concerns arise, please do not hesitate to contact our office at [phone]   Diabetes Mellitus and Nutrition When you have diabetes (diabetes mellitus), it is very important to have healthy eating habits because your blood sugar (glucose) levels are greatly affected by what you eat and drink. Eating healthy foods in the appropriate amounts, at about the same times every day, can help you:  Control your blood glucose.  Lower your risk of heart disease.  Improve your blood pressure.  Reach or maintain a healthy weight.  Every person with diabetes is different, and each person has different needs for a meal  plan. Your health care provider may recommend that you work with a diet and nutrition specialist (dietitian) to make a meal plan that is best for you. Your meal plan may vary depending on factors such as:  The calories you need.  The medicines you take.  Your weight.  Your blood glucose, blood pressure, and cholesterol levels.  Your activity level.  Other health conditions you have, such as heart or kidney disease.  How do carbohydrates affect me? Carbohydrates affect your blood glucose level more than any other type of food. Eating carbohydrates naturally increases the amount of glucose in your blood. Carbohydrate counting is a method for keeping track of how many carbohydrates you eat. Counting carbohydrates is important to keep your blood glucose at a healthy level, especially if you use insulin or take certain oral diabetes medicines. It is important to know how many carbohydrates you can safely have in each meal. This is different for every person. Your dietitian can help you calculate how many carbohydrates you should have at each meal and for snack. Foods that contain carbohydrates include:  Bread, cereal, rice, pasta, and crackers.  Potatoes and corn.  Peas, beans, and lentils.  Milk and yogurt.  Fruit and juice.  Desserts, such as cakes, cookies, ice cream, and candy.  How does alcohol affect me? Alcohol can cause a sudden decrease in blood glucose (hypoglycemia), especially if you use insulin or take certain oral diabetes medicines. Hypoglycemia can be a life-threatening condition. Symptoms of hypoglycemia (sleepiness, dizziness, and confusion) are similar to symptoms of having too much alcohol. If your health care provider says that alcohol is safe for you, follow these guidelines:  Limit alcohol intake to no more than 1 drink per day for nonpregnant women and 2 drinks per day for men. One drink equals 12 oz of beer, 5 oz of wine, or 1 oz of hard liquor.  Do not drink  on an empty stomach.  Keep yourself hydrated with water, diet soda, or unsweetened iced tea.  Keep in mind that regular soda, juice, and other mixers may contain a lot of sugar and must be counted as carbohydrates.  What are tips for following this plan? Reading food labels  Start by checking the serving size on the label. The amount of calories, carbohydrates, fats, and other nutrients listed on the label are based on one serving of the food. Many foods contain more than one serving per package.  Check the total grams (g) of carbohydrates in one serving. You can calculate the number of  servings of carbohydrates in one serving by dividing the total carbohydrates by 15. For example, if a food has 30 g of total carbohydrates, it would be equal to 2 servings of carbohydrates.  Check the number of grams (g) of saturated and trans fats in one serving. Choose foods that have low or no amount of these fats.  Check the number of milligrams (mg) of sodium in one serving. Most people should limit total sodium intake to less than 2,300 mg per day.  Always check the nutrition information of foods labeled as "low-fat" or "nonfat". These foods may be higher in added sugar or refined carbohydrates and should be avoided.  Talk to your dietitian to identify your daily goals for nutrients listed on the label. Shopping  Avoid buying canned, premade, or processed foods. These foods tend to be high in fat, sodium, and added sugar.  Shop around the outside edge of the grocery store. This includes fresh fruits and vegetables, bulk grains, fresh meats, and fresh dairy. Cooking  Use low-heat cooking methods, such as baking, instead of high-heat cooking methods like deep frying.  Cook using healthy oils, such as olive, canola, or sunflower oil.  Avoid cooking with butter, cream, or high-fat meats. Meal planning  Eat meals and snacks regularly, preferably at the same times every day. Avoid going long periods  of time without eating.  Eat foods high in fiber, such as fresh fruits, vegetables, beans, and whole grains. Talk to your dietitian about how many servings of carbohydrates you can eat at each meal.  Eat 4-6 ounces of lean protein each day, such as lean meat, chicken, fish, eggs, or tofu. 1 ounce is equal to 1 ounce of meat, chicken, or fish, 1 egg, or 1/4 cup of tofu.  Eat some foods each day that contain healthy fats, such as avocado, nuts, seeds, and fish. Lifestyle   Check your blood glucose regularly.  Exercise at least 30 minutes 5 or more days each week, or as told by your health care provider.  Take medicines as told by your health care provider.  Do not use any products that contain nicotine or tobacco, such as cigarettes and e-cigarettes. If you need help quitting, ask your health care provider.  Work with a Veterinary surgeon or diabetes educator to identify strategies to manage stress and any emotional and social challenges. What are some questions to ask my health care provider?  Do I need to meet with a diabetes educator?  Do I need to meet with a dietitian?  What number can I call if I have questions?  When are the best times to check my blood glucose? Where to find more information:  American Diabetes Association: diabetes.org/food-and-fitness/food  Academy of Nutrition and Dietetics: https://www.vargas.com/  [employer] of Diabetes and Digestive and Kidney Diseases (NIH): FindJewelers.cz Summary  A healthy meal plan will help you control your blood glucose and maintain a healthy lifestyle.  Working with a diet and nutrition specialist (dietitian) can help you make a meal plan that is best for you.  Keep in mind that carbohydrates and alcohol have immediate effects on your blood glucose levels. It is important to count carbohydrates and to use alcohol  carefully. This information is not intended to replace advice given to you by your health care provider. Make sure you discuss any questions you have with your health care provider. Document Released: 02/15/2005 Document Revised: 06/25/2016 Document Reviewed: 06/25/2016 Elsevier Interactive Patient Education  [employer].

## 2017-09-10 NOTE — Care Management Note (Signed)
Case Management Note Donn PieriniKristi Inger Wiest RN, BSN Unit 4E-Case Manager-- 2H coverage [phone]  Patient Details  Name: Curtis Little MRN: [id number] Date of Birth: [birth year]  Subjective/Objective:  Pt admitted with NSTEMI, MVD found- plan for CABG on 09/05/17                  Action/Plan: PTA Pt lived at home with wife, independent- CM to follow post op for transition of care needs.   Expected Discharge Date:  09/10/17               Expected Discharge Plan:  Home/Self Care  In-House Referral:     Discharge planning Services  CM Consult  Post Acute Care Choice:  NA Choice offered to:  NA  DME Arranged:    DME Agency:     HH Arranged:    HH Agency:     Status of Service:  Completed, signed off  If discussed at Long Length of Stay Meetings, dates discussed:  4/9  Discharge Disposition: home/self care   Additional Comments:  09/10/17- 1030- Dickey Caamano RN CM- pt for d/c home today- pt has short term disability paperwork- bedside RN to instruct wife to take paperwork by MD office to have filled out. No CM needs noted for transition home.   Darrold SpanWebster, Ezrael Sam Hall, RN 09/10/2017, 10:37 AM

## 2017-09-10 NOTE — Progress Notes (Signed)
D/c instructions given to pt and wife. Prescriptions given. Chest tube sutures removed. IV removed, clean and intact. Telemetry removed. Wife to escort home.  Versie StarksHanna  Yanely Mast, RN

## 2017-09-10 NOTE — Progress Notes (Addendum)
      [address]       [place] [id number]             [phone]        5 Days Post-Op Procedure(s) (LRB): CORONARY ARTERY BYPASS GRAFTING (CABG) x four , using left internal mammary artery and right leg greater saphenous vein harvested endoscopically (N/A) TRANSESOPHAGEAL ECHOCARDIOGRAM (TEE) (N/A)  Subjective: Patient without complaints this am. He wants to go home.  Objective: Vital signs in last 24 hours: Temp:  [97.8 F (36.6 C)-98.5 F (36.9 C)] 97.8 F (36.6 C) (04/09 0517) Pulse Rate:  [78-87] 81 (04/09 0517) Cardiac Rhythm: Normal sinus rhythm (04/08 1943) Resp:  [15-26] 18 (04/09 0517) BP: (125-144)/(57-86) 134/68 (04/09 0517) SpO2:  [95 %-98 %] 97 % (04/09 0517) Weight:  [181 lb (82.1 kg)] 181 lb (82.1 kg) (04/09 0517)  Pre op weight 79.5 kg Current Weight  09/10/17 181 lb (82.1 kg)      Intake/Output from previous day: 04/08 0701 - 04/09 0700 In: 660 [P.O.:660] Out: -    Physical Exam:  Cardiovascular: RRR Pulmonary: Slightly decreased at bases Abdomen: Soft, non tender, bowel sounds present. Extremities: Mild bilateral lower extremity edema. Wounds: Clean and dry.  No erythema or signs of infection.  Lab Results: CBC: Recent Labs    09/08/17 0531 09/09/17 0306  WBC 7.8 8.6  HGB 10.3* 11.0*  HCT 30.6* 32.9*  PLT 192 261   BMET:  Recent Labs    09/09/17 0306 09/10/17 0330  NA 135 136  K 3.6 3.7  CL 97* 101  CO2 27 27  GLUCOSE 107* 150*  BUN 14 15  CREATININE 0.77 0.76  CALCIUM 8.4* 8.3*    PT/INR:  Lab Results  Component Value Date   INR 1.25 09/05/2017   INR 1.09 09/02/2017   ABG:  INR: Will add last result for INR, ABG once components are confirmed Will add last 4 CBG results once components are confirmed  Assessment/Plan:  1. CV - S/p NSTEMI. SR. On Coreg 6.25 mg bid and Lisinopril 5 mg daily 2.  Pulmonary - On room air. Encourage incentive spirometer. 3. Volume Overload - On Lasix 40 mg  daily. 4.  Acute blood loss anemia - H and H stable at 11 and 32.9 5. DM-CBGs 140/0191/164. On Glipizide 5 mg bid, Insulin. Pre op HGA1C 9.1 6. Supplement potassium 7. Remove sutures 8. Discharge  Donielle M ZimmermanPA-C 09/10/2017,7:10 AM  patient examined and medical record reviewed,agree with above note. Kathlee Nationseter Van Trigt III 09/10/2017

## 2017-09-16 ENCOUNTER — Other Ambulatory Visit: Payer: Self-pay

## 2017-09-16 ENCOUNTER — Telehealth: Payer: Self-pay

## 2017-09-16 MED ORDER — FUROSEMIDE 40 MG PO TABS: 40.0000 mg | ORAL_TABLET | Freq: Every day | ORAL | 0 refills | Status: DC

## 2017-09-16 MED ORDER — POTASSIUM CHLORIDE CRYS ER 20 MEQ PO TBCR: 20.0000 meq | EXTENDED_RELEASE_TABLET | Freq: Every day | ORAL | 0 refills | Status: DC

## 2017-09-16 NOTE — Telephone Encounter (Signed)
Patient's wife called and stated that he has had swelling in his feet and "heavyness" in his chest when he lays down at night.  Stated that the on call doctor advised to call Monday morning to get a refill of Lasix.  Prescription was called into the pharmacy for Lasix and potassium taken daily for 14 days until seen by a Cardiologist.  Patient acknowledged receipt.  Also advised her to make sure he was keeping his legs elevated when seated.  Also could use compression stockings to help if he was up and walking.  She acknowledged receipt.

## 2017-09-19 ENCOUNTER — Telehealth: Payer: Self-pay

## 2017-09-19 DIAGNOSIS — T8149XA Infection following a procedure, other surgical site, initial encounter: Secondary | ICD-10-CM

## 2017-09-19 MED ORDER — CEPHALEXIN 500 MG PO CAPS: 500.0000 mg | ORAL_CAPSULE | Freq: Three times a day (TID) | ORAL | 0 refills | Status: DC

## 2017-09-19 NOTE — Telephone Encounter (Signed)
Patient called C/O redness and drainage at lower sternal incision site. It started yesterday. The drainage is thick white pus and redness is spreading. He denies any fevers or increased pain at site. Erin Barrett-PA reviewed and ordered keflex 500 mg TID for 10 days. And patient was instructed to call back Monday if he has no improvement for a office visit.

## 2017-09-30 ENCOUNTER — Ambulatory Visit
Admission: RE | Admit: 2017-09-30 | Discharge: 2017-09-30 | Disposition: A | Discharge status: Home / Self Care | Payer: BC Managed Care – PPO | Source: Ambulatory Visit | Attending: Nurse Practitioner | Admitting: Nurse Practitioner

## 2017-09-30 ENCOUNTER — Encounter: Payer: Self-pay | Admitting: Nurse Practitioner

## 2017-09-30 ENCOUNTER — Ambulatory Visit: Payer: BC Managed Care – PPO | Admitting: Nurse Practitioner

## 2017-09-30 VITALS — BP 130/70 | HR 64 | Ht 67.0 in | Wt 182.0 lb

## 2017-09-30 DIAGNOSIS — Z720 Tobacco use: Secondary | ICD-10-CM

## 2017-09-30 DIAGNOSIS — Z951 Presence of aortocoronary bypass graft: Secondary | ICD-10-CM | POA: Diagnosis not present

## 2017-09-30 DIAGNOSIS — I214 Non-ST elevation (NSTEMI) myocardial infarction: Secondary | ICD-10-CM

## 2017-09-30 DIAGNOSIS — E7849 Other hyperlipidemia: Secondary | ICD-10-CM | POA: Diagnosis not present

## 2017-09-30 DIAGNOSIS — I10 Essential (primary) hypertension: Secondary | ICD-10-CM | POA: Diagnosis not present

## 2017-09-30 LAB — BASIC METABOLIC PANEL
BUN/Creatinine Ratio: 13 (ref 10–24)
BUN: 12 mg/dL (ref 8–27)
CO2: 25 mmol/L (ref 20–29)
Calcium: 9.4 mg/dL (ref 8.6–10.2)
Chloride: 100 mmol/L (ref 96–106)
Creatinine, Ser: 0.9 mg/dL (ref 0.76–1.27)
GFR calc Af Amer: 103 mL/min/{1.73_m2} (ref >59)
GFR calc non Af Amer: 89 mL/min/{1.73_m2} (ref >59)
Glucose: 56 mg/dL — ABNORMAL LOW (ref 65–99)
Potassium: 4.6 mmol/L (ref 3.5–5.2)
Sodium: 139 mmol/L (ref 134–144)

## 2017-09-30 LAB — CBC
Hematocrit: 36.1 % — ABNORMAL LOW (ref 37.5–51.0)
Hemoglobin: 12.4 g/dL — ABNORMAL LOW (ref 13.0–17.7)
MCH: 29.7 pg (ref 26.6–33.0)
MCHC: 34.3 g/dL (ref 31.5–35.7)
MCV: 86 fL (ref 79–97)
Platelets: 338 10*3/uL (ref 150–379)
RBC: 4.18 x10E6/uL (ref 4.14–5.80)
RDW: 13.2 % (ref 12.3–15.4)
WBC: 7.9 10*3/uL (ref 3.4–10.8)

## 2017-09-30 MED ORDER — FUROSEMIDE 40 MG PO TABS: 40.0000 mg | ORAL_TABLET | Freq: Every day | ORAL | 0 refills | Status: DC

## 2017-09-30 MED ORDER — POTASSIUM CHLORIDE CRYS ER 20 MEQ PO TBCR: 20.0000 meq | EXTENDED_RELEASE_TABLET | Freq: Every day | ORAL | 0 refills | Status: DC

## 2017-09-30 MED ORDER — OLMESARTAN MEDOXOMIL 20 MG PO TABS: 20.0000 mg | ORAL_TABLET | Freq: Every day | ORAL | 6 refills | Status: DC

## 2017-09-30 NOTE — Patient Instructions (Addendum)
We will be checking the following labs today - BMET & CBC  Please go to [facility] on the first floor for a chest Xray - you may walk in. We will then decide about stopping the Lasix and potassium.    Medication Instructions:    Continue with your current medicines. BUT  STOP Lisinopril - this may be why you are coughing.   START Olmesartan 20 mg a day - this has been sent to your pharmacy    Testing/Procedures To Be Arranged:  N/A  Follow-Up:   See Dr. Eldridge Dace and his team in 6 weeks with fasting labs     Other Special Instructions:   I will send a message to cardiac rehab  Minimize the eating out    If you need a refill on your cardiac medications before your next appointment, please call your pharmacy.   Call the [facility] office at [phone] if you have any questions, problems or concerns.

## 2017-09-30 NOTE — Progress Notes (Signed)
CARDIOLOGY OFFICE NOTE  Date:  09/30/2017    [name] [facility] Date of Birth: 09-12-1950 Medical Record #[id number]  PCP:  Richardean Chimera, MD  Cardiologist:  [facility]   Chief Complaint  Patient presents with  . Coronary Artery Disease    Post hospital visit - seen for    History of Present Illness: Curtis Little is a 67 y.o. male who presents today for a post hospital visit. Seen for Dr. Eldridge Dace.   He has a history of known CAD with prior NSTEMI in 2010 with BMS to the mid LCX with residual disease in the RCA by Dr. Eldridge Dace.   He also has history of hyperlipidemia, hypertension, Type 2 DM and nicotine use.     On 09/01/2017 he presented to the ED with chest pain.  He ruled in for NSTEMI.  Cardiac catheterization was performed. This showed severe 3 vessel disease with severe progression of previous CAD.  It was felt coronary bypass would be indicated and TCTS consult was requested.  The patient was evaluated by Dr. Donata Clay who was in agreement the patient would benefit from bypass surgery.  He was taken to the operating room on 09/05/2017.  He underwent CABG x 4 utilizing LIMA to LAD, SVG to diagonal, SVG to OM1, and SVG to RCA.  Post op course was uneventful while in the hospital - but noted to have issues with his incision and volume overload.   Comes in today. Here with his wife today. Coughing lots - does not use his pillow - says he was not really told to use this at discharge. His chest is "sore". No fever or chills. Incision looking better. He has a mostly dry cough - he was on ACE prior to surgery. Tells me at the end of the visit that he was pretty sure that the ACE made him cough in the past and it was stopped. Breathing is ok. Walking at home. Bowels ok. Appetite ok. No real concerns. They eat out considerably - get way too much salt. He has no real concerns today.   Past Medical History:  Diagnosis Date  . Coronary artery disease    NSTEMI with significant  2 vessell  disease ...bare metal stent to mid circumflex and  residual disease  on RCA ( 80%) diffuse. Normal LV function 02/05/09   . Diabetes mellitus without complication (HCC)   . Hypertension     Past Surgical History:  Procedure Laterality Date  . CORONARY ARTERY BYPASS GRAFT N/A 09/05/2017   Procedure: CORONARY ARTERY BYPASS GRAFTING (CABG) x four , using left internal mammary artery and right leg greater saphenous vein harvested endoscopically;  Surgeon: Kerin Perna, MD;  Location: [facility] OR;  Service: Open Heart Surgery;  Laterality: N/A;  . LEFT HEART CATH AND CORONARY ANGIOGRAPHY N/A 09/01/2017   Procedure: LEFT HEART CATH AND CORONARY ANGIOGRAPHY;  Surgeon: Corky Crafts, MD;  Location: [facility] INVASIVE CV LAB;  Service: Cardiovascular;  Laterality: N/A;  . TEE WITHOUT CARDIOVERSION N/A 09/05/2017   Procedure: TRANSESOPHAGEAL ECHOCARDIOGRAM (TEE);  Surgeon: Donata Clay, Theron Arista, MD;  Location: [facility] OR;  Service: Open Heart Surgery;  Laterality: N/A;     Medications: Current Meds  Medication Sig  . atorvastatin (LIPITOR) 80 MG tablet Take 1 tablet (80 mg total) by mouth daily.  . carvedilol (COREG) 6.25 MG tablet Take 1 tablet (6.25 mg total) by mouth 2 (two) times daily with a meal.  . cephALEXin (KEFLEX) 500 MG capsule  Take 1 capsule (500 mg total) by mouth 3 (three) times daily. 1 capsule (500 mg) by mouth 3 three times per day x 10 days  . glipiZIDE (GLUCOTROL) 5 MG tablet Take by mouth 2 (two) times daily before a meal.  . oxyCODONE (OXY IR/ROXICODONE) 5 MG immediate release tablet Take 5 mg by mouth every 4-6 hours PRN severe pain.  . traZODone (DESYREL) 50 MG tablet Take 200 mg by mouth at bedtime.   . [DISCONTINUED] aspirin EC 325 MG EC tablet Take 1 tablet (325 mg total) by mouth daily. (Patient taking differently: Take 1 tablet (325 mg total) by mouth daily.)  . [DISCONTINUED] lisinopril (PRINIVIL,ZESTRIL) 5 MG tablet Take 1 tablet (5 mg total) by mouth daily.  . [DISCONTINUED] TRESIBA  FLEXTOUCH 200 UNIT/ML SOPN Inject 70 Units into the skin daily.      Allergies: No Known Allergies  Social History: The patient  reports that he quit smoking about 19 years ago. He has never used smokeless tobacco. He reports that he drinks about 0.6 oz of alcohol per week. He reports that he does not use drugs.   Family History: The patient's family history includes Colon cancer in his mother; Hypertension in his daughter; Other in his sister; Stroke in his father.   Review of Systems: Please see the history of present illness.   Otherwise, the review of systems is positive for none.   All other systems are reviewed and negative.   Physical Exam: VS:  BP 130/70 (BP Location: Left Arm, Patient Position: Sitting, Cuff Size: Normal)   Pulse 64   Ht  (1.702 m)   Wt 182 lb (82.6 kg)   BMI 28.51 kg/m  .  BMI Body mass index is 28.51 kg/m.  Wt Readings from Last 3 Encounters:  09/30/17 182 lb (82.6 kg)  09/10/17 181 lb (82.1 kg)    General: Alert and in no acute distress.   HEENT: Normal.  Neck: Supple, no JVD, carotid bruits, or masses noted.  Cardiac: Regular rate and rhythm. No murmurs, rubs, or gallops. No edema. His sternum looks ok - no obvious infection noted. One steri strip over old chest tube site loosened.  Respiratory:  Lungs little coarse - few crackles but with normal work of breathing.  GI: Soft and nontender.  MS: No deformity or atrophy. Gait and ROM intact.  Skin: Warm and dry. Color is normal.  Neuro:  Strength and sensation are intact and no gross focal deficits noted.  Psych: Alert, appropriate and with normal affect.   LABORATORY DATA:  EKG:  EKG is ordered today. This demonstrates NSR with inferior Q's.  Lab Results  Component Value Date   WBC 8.6 09/09/2017   HGB 11.0 (L) 09/09/2017   HCT 32.9 (L) 09/09/2017   PLT 261 09/09/2017   GLUCOSE 150 (H) 09/10/2017   CHOL 167 09/02/2017   TRIG 142 09/02/2017   HDL 34 (L) 09/02/2017   LDLCALC 105  (H) 09/02/2017   ALT 27 09/02/2017   AST 68 (H) 09/02/2017   NA 136 09/10/2017   K 3.7 09/10/2017   CL 101 09/10/2017   CREATININE 0.76 09/10/2017   BUN 15 09/10/2017   CO2 27 09/10/2017   TSH 2.058 09/02/2017   INR 1.25 09/05/2017   HGBA1C 9.1 (H) 09/02/2017     BNP (last 3 results) Recent Labs    09/01/17 1129  BNP 965.6*    ProBNP (last 3 results) No results for input(s): PROBNP in the last  8760 hours.   Other Studies Reviewed Today:  TEE (OR) 09/2017 Result status: Final result    Aortic valve: The valve is trileaflet. No stenosis. No regurgitation.  Mitral valve: Mild regurgitation, central jet.  Right ventricle: Normal cavity size, wall thickness and ejection fraction.  Tricuspid valve: Trace regurgitation, regurgitation jet is central.  Left ventricle: Normal LV size, LVEF 50-55%, no significant wall motion abnormalities  Left Atrial Appendage: normal velocities, no thrombus present   Post Bypass:  Tricuspid, Pulmonic, Mitral and Aortic valve unchanged. Inferoseptal and anterolateral segments of LV mildly hypokinetic but improving. LV CO 5.4, 4.9 with minimal pressor support. No aortic dissection present after aortic cannula removed.   Kaylyn Layer Hart [place], MD, MBA Anesthesiology    TTE Study Conclusions 09/2017  - Left ventricle: Wall thickness was increased in a pattern of   moderate LVH. Systolic function was normal. The estimated   ejection fraction was in the range of 50% to 55%. Wall motion was   normal; there were no regional wall motion abnormalities. Doppler   parameters are consistent with abnormal left ventricular   relaxation (grade 1 diastolic dysfunction).  Significant Diagnostic Studies: angiography: 08/2017   Ost Cx to Mid Cx lesion is 100% stenosed. There are left to left collaterals.  Mid Cx to Dist Cx lesion is 25% stenosed.  Ost LM to Mid LM lesion is 50% stenosed.  Ost LAD to Prox LAD lesion is 75% stenosed.  Ost 1st Diag  to 1st Diag lesion is 75% stenosed.  Prox LAD to Mid LAD lesion is 60% stenosed.  Mid RCA lesion is 25% instent restenosis in the proximal portion of the Taxus stent.  Prox RCA to Mid RCA lesion is 90% stenosed.  Dist RCA lesion is 75% stenosed.  The left ventricular ejection fraction is 35-45% by visual estimate.  There is mild to moderate left ventricular systolic dysfunction.  LV end diastolic pressure is mildly elevated.  There is no aortic valve stenosis.  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  Severe three vessel CAD. Plan for cardiac surgery referral.   Treatments: surgery:   1. Coronary artery bypass grafting x4 (left internal mammary artery to LAD, saphenous vein graft to diagonal, saphenous vein graft to the OM1, and saphenous vein graft to right coronary artery). 2. Endoscopic harvest of right leg greater saphenous vein.   Assessment/Plan:  1. NSTEMI with subsequent CABG x 4 - stable progress - needs labs today. He has had to continue on Lasix/potassium due to swelling/volume overload - cough persists - will go ahead and check CXR today. Check labs today. Refer to cardiac rehab. Encouraged him to use his pillow to splint his chest.   2. Cough - may just be all ACE related - apparently this happened in the past - will stop and place on ARB today. See what his CXR and labs show - may just do another 2 or 3 days of Lasix and then stop. Further disposition to follow.   3. DM - uncontrolled - per PCP  4. HLD - on high dose statin  5. Remote smoker.   Current medicines are reviewed with the patient today.  The patient does not have concerns regarding medicines other than what has been noted above.  The following changes have been made:  See above.  Labs/ tests ordered today include:    Orders Placed This Encounter  Procedures  . DG Chest 2 View  . Basic metabolic  panel  . CBC  . EKG 12-Lead     Disposition:   FU  with Dr. Eldridge Dace and his team in 6 weeks with fasting labs.   Patient is agreeable to this plan and will call if any problems develop in the interim.   SignedNorma Fredrickson, NP  09/30/2017 11:02 AM  [facility] [address] [place], [place]  [address] Phone: [phone] Fax: [phone]

## 2017-10-01 ENCOUNTER — Other Ambulatory Visit: Payer: Self-pay | Admitting: *Deleted

## 2017-10-01 MED ORDER — OLMESARTAN MEDOXOMIL 20 MG PO TABS: 20.0000 mg | ORAL_TABLET | Freq: Every day | ORAL | 6 refills | Status: DC

## 2017-10-06 ENCOUNTER — Telehealth: Payer: Self-pay | Admitting: Thoracic Surgery (Cardiothoracic Vascular Surgery)

## 2017-10-06 DIAGNOSIS — T8149XA Infection following a procedure, other surgical site, initial encounter: Secondary | ICD-10-CM

## 2017-10-06 MED ORDER — CEPHALEXIN 500 MG PO CAPS: 500.0000 mg | ORAL_CAPSULE | Freq: Three times a day (TID) | ORAL | 0 refills | Status: DC

## 2017-10-06 NOTE — Telephone Encounter (Signed)
Curtis Little called to say infection at lower part of Curtis Little's sternal wound has returned. Redness between size of quarter and 50 cent piece. No drainage but there may be some pus. Underneath.  Was treated with Keflex a couple of weeks ago and appeared to be resolved.  No fevers or chills, no motion in sternum  Will reorder Keflex Instructed that they need to be seen in the office tomorrow. They will call in the morning to determine time.  Salvatore Decent Dorris Fetch, MD [facility] [phone]

## 2017-10-07 ENCOUNTER — Other Ambulatory Visit: Payer: Self-pay

## 2017-10-07 ENCOUNTER — Ambulatory Visit (INDEPENDENT_AMBULATORY_CARE_PROVIDER_SITE_OTHER): Payer: Self-pay | Admitting: Surgical

## 2017-10-07 VITALS — BP 159/86 | HR 95 | Resp 18 | Ht 67.0 in | Wt 181.6 lb

## 2017-10-07 DIAGNOSIS — T8149XA Infection following a procedure, other surgical site, initial encounter: Secondary | ICD-10-CM

## 2017-10-07 DIAGNOSIS — T8132XD Disruption of internal operation (surgical) wound, not elsewhere classified, subsequent encounter: Secondary | ICD-10-CM

## 2017-10-07 DIAGNOSIS — L089 Local infection of the skin and subcutaneous tissue, unspecified: Secondary | ICD-10-CM | POA: Insufficient documentation

## 2017-10-07 NOTE — Patient Instructions (Signed)
Change dressing daily with sterile 4 x 4 or 2 x 2

## 2017-10-07 NOTE — Progress Notes (Signed)
[address]       [place] [address]             [phone]      Curtis Little [place] Medical Record #[id number] Date of Birth: May 24, 1951  Referring: Corky Crafts, MD Primary Care: Richardean Chimera, MD Primary Cardiologist: No primary care provider on file.   Chief Complaint:   POST OP FOLLOW UP  DATE OF PROCEDURE:  09/05/2017 DATE OF DISCHARGE:                              OPERATIVE REPORT   OPERATION: 1. Coronary artery bypass grafting x4 (left internal mammary artery to     LAD, saphenous vein graft to diagonal, saphenous vein graft to the     OM1, and saphenous vein graft to right coronary artery). 2. Endoscopic harvest of right leg greater saphenous vein.  SURGEON:  Kerin Perna, M.D.  ASSISTANT:  Doree Fudge, PA-C.   History of Present Illness:    Patient is a 67 year old male status post the above described procedure.  He is seen in the office on today's date as he has developed some erythema at the lower pole of his sternal incision.  He denies fevers, chills or other constitutional symptoms.  He was treated recently with a course of Keflex for a sternal cellulitis with good resolution but yesterday this reappeared and he felt as though there might be some fluid underneath the incision.  He called the office yesterday and was started on a repeat course of Keflex for 10 days 500 3 times daily by Dr. Dorris Fetch.  We are seeing him today for further evaluation.      Past Medical History:  Diagnosis Date  . Coronary artery disease    NSTEMI with significant  2 vessell disease ...bare metal stent to mid circumflex and  residual disease  on RCA ( 80%) diffuse. Normal LV function 02/05/09   . Diabetes mellitus without complication (HCC)   . Hypertension      Social History   Tobacco Use  Smoking Status Former Smoker  . Last attempt to quit: 09/02/1998  . Years since quitting: 19.1  Smokeless Tobacco Never Used     Social History   Substance and Sexual Activity  Alcohol Use Yes  . Alcohol/week: 0.6 oz  . Types: 1 Cans of beer per week     No Known Allergies  Current Outpatient Medications  Medication Sig Dispense Refill  . atorvastatin (LIPITOR) 80 MG tablet Take 1 tablet (80 mg total) by mouth daily. 30 tablet 1  . carvedilol (COREG) 6.25 MG tablet Take 1 tablet (6.25 mg total) by mouth 2 (two) times daily with a meal. 60 tablet 1  . cephALEXin (KEFLEX) 500 MG capsule Take 1 capsule (500 mg total) by mouth 3 (three) times daily. 1 capsule (500 mg) by mouth 3 three times per day x 10 days 30 capsule 0  . furosemide (LASIX) 40 MG tablet Take 1 tablet (40 mg total) by mouth daily. Until evaluated by Cardiologist. 7 tablet 0  . glipiZIDE (GLUCOTROL) 5 MG tablet Take by mouth 2 (two) times daily before a meal.    . olmesartan (BENICAR) 20 MG tablet Take 1 tablet (20 mg total) by mouth daily. 30 tablet 6  . oxyCODONE (OXY IR/ROXICODONE) 5 MG immediate release tablet Take 5 mg by mouth every 4-6 hours PRN  severe pain. 30 tablet 0  . potassium chloride SA (K-DUR,KLOR-CON) 20 MEQ tablet Take 1 tablet (20 mEq total) by mouth daily. Until evaluated by Cardiologist. 7 tablet 0  . traZODone (DESYREL) 50 MG tablet Take 200 mg by mouth at bedtime.   2  . olmesartan (BENICAR) 40 MG tablet Take 20 mg by mouth daily.  6   No current facility-administered medications for this visit.        Physical Exam: BP (!) 159/86 (BP Location: Right Arm, Patient Position: Sitting, Cuff Size: Normal)   Pulse 95   Resp 18   Ht  (1.702 m)   Wt 82.4 kg (181 lb 9.6 oz)   SpO2 98% Comment: RA  BMI 28.44 kg/m   Wound: The wound has a small amount of erythema and flocculence at the lower pole.  It is not warm to touch.  There did appear to be a small fluid collection so I did a tiny incision and drainage procedure and cultured a slightly purulent fluid.  I then probed the wound and it is less than 1 cm in depth and  does not appear to track at all.  I covered the wound with a dry sterile dressing.   Diagnostic Studies & Laboratory data:     Recent Radiology Findings:   No results found.    Recent Lab Findings: Lab Results  Component Value Date   WBC 7.9 09/30/2017   HGB 12.4 (L) 09/30/2017   HCT 36.1 (L) 09/30/2017   PLT 338 09/30/2017   GLUCOSE 56 (L) 09/30/2017   CHOL 167 09/02/2017   TRIG 142 09/02/2017   HDL 34 (L) 09/02/2017   LDLCALC 105 (H) 09/02/2017   ALT 27 09/02/2017   AST 68 (H) 09/02/2017   NA 139 09/30/2017   K 4.6 09/30/2017   CL 100 09/30/2017   CREATININE 0.90 09/30/2017   BUN 12 09/30/2017   CO2 25 09/30/2017   TSH 2.058 09/02/2017   INR 1.25 09/05/2017   HGBA1C 9.1 (H) 09/02/2017      Assessment / Plan: Superficial sternal infection less than 1 cm deep.  This may be consistent with either stitch abscess or fatty necrosis.  The sternum itself feels quite stable.  He is scheduled to see Dr. Maren Beach in the office on Wednesday and we can recheck at this time.  He knows to contact the office if symptoms worsen.  He will continue his Keflex as prescribed for a full 10-day course.          Rowe Clack, PA-C 10/07/2017 2:59 PM

## 2017-10-09 ENCOUNTER — Other Ambulatory Visit: Payer: Self-pay | Admitting: Cardiothoracic Surgery

## 2017-10-09 ENCOUNTER — Encounter: Payer: BC Managed Care – PPO | Admitting: Cardiothoracic Surgery

## 2017-10-09 DIAGNOSIS — Z951 Presence of aortocoronary bypass graft: Secondary | ICD-10-CM

## 2017-10-10 LAB — WOUND CULTURE
MICRO NUMBER:: 90549436
SPECIMEN QUALITY:: ADEQUATE

## 2017-10-16 ENCOUNTER — Telehealth: Payer: Self-pay

## 2017-10-16 DIAGNOSIS — A4902 Methicillin resistant Staphylococcus aureus infection, unspecified site: Secondary | ICD-10-CM

## 2017-10-16 MED ORDER — CIPROFLOXACIN HCL 500 MG PO TABS: 500.0000 mg | ORAL_TABLET | Freq: Two times a day (BID) | ORAL | 0 refills | Status: DC

## 2017-10-16 NOTE — Telephone Encounter (Signed)
Gershon Crane PA reviewed wound CX results and changed ABX from Keflex to Cipro 50 mg BID x 7 days next appt is scheduled for 10/21/17. Patient is aware

## 2017-10-21 ENCOUNTER — Ambulatory Visit
Admission: RE | Admit: 2017-10-21 | Discharge: 2017-10-21 | Disposition: A | Discharge status: Home / Self Care | Payer: BC Managed Care – PPO | Source: Ambulatory Visit | Attending: Cardiothoracic Surgery | Admitting: Cardiothoracic Surgery

## 2017-10-21 ENCOUNTER — Ambulatory Visit (INDEPENDENT_AMBULATORY_CARE_PROVIDER_SITE_OTHER): Payer: Self-pay | Admitting: Cardiothoracic Surgery

## 2017-10-21 ENCOUNTER — Other Ambulatory Visit: Payer: Self-pay

## 2017-10-21 VITALS — BP 144/77 | HR 90 | Resp 16 | Ht 67.0 in | Wt 180.0 lb

## 2017-10-21 DIAGNOSIS — T8149XA Infection following a procedure, other surgical site, initial encounter: Secondary | ICD-10-CM

## 2017-10-21 DIAGNOSIS — Z951 Presence of aortocoronary bypass graft: Secondary | ICD-10-CM

## 2017-10-21 NOTE — Progress Notes (Signed)
PCP is Richardean Chimera, MD Referring Provider is Corky Crafts, MD  Chief Complaint  Patient presents with  . Routine Post Op    s/p CABG X 4 WITH STERNAL INFECTION...PLACED ON CIPRO ON 10/16/17    HPI: Patient returns for scheduled follow-up 2 weeks after being last seen at the [facility].  Patient had multivessel CABG in early April for unstable angina.  He developed a superficial infection of the lower sternal incision with some drainage.  This was initially treated with Keflex.  On a follow-up visit 2 weeks ago a culture grew out MRSA.  There was positive sensitivity to Cipro and the Keflex was stopped and Cipro was started.  The patient states the area has improved.  There is no more drainage.  Is nontender.  Is still mildly erythematous.  He denies fever.  The patient is a diabetic and states his blood sugars have been under good control.  Today I personally examined the patient.  The sternum is stable and well-healed.  There is some erythema around the area of the xiphoid.  There is no fluctuance or evidence of a subcutaneous collection of fluid.  The patient will finish his course of Cipro.  He will additionally take doxycycline 100 mg p.o. twice daily for 14 days.  He will also apply Bactroban to the area twice a day.  He will return for follow-up wound check by myself in 10 days. Past Medical History:  Diagnosis Date  . Coronary artery disease    NSTEMI with significant  2 vessell disease ...bare metal stent to mid circumflex and  residual disease  on RCA ( 80%) diffuse. Normal LV function 02/05/09   . Diabetes mellitus without complication (HCC)   . Hypertension     Past Surgical History:  Procedure Laterality Date  . CORONARY ARTERY BYPASS GRAFT N/A 09/05/2017   Procedure: CORONARY ARTERY BYPASS GRAFTING (CABG) x four , using left internal mammary artery and right leg greater saphenous vein harvested endoscopically;  Surgeon: Kerin Perna, MD;  Location: [facility] OR;  Service:  Open Heart Surgery;  Laterality: N/A;  . LEFT HEART CATH AND CORONARY ANGIOGRAPHY N/A 09/01/2017   Procedure: LEFT HEART CATH AND CORONARY ANGIOGRAPHY;  Surgeon: Corky Crafts, MD;  Location[facility] INVASIVE CV LAB;  Service: Cardiovascular;  Laterality: N/A;  . TEE WITHOUT CARDIOVERSION N/A 09/05/2017   Procedure: TRANSESOPHAGEAL ECHOCARDIOGRAM (TEE);  Surgeon: Donata Clay, Theron Arista, MD;  Location: [facility] OR;  Service: Open Heart Surgery;  Laterality: N/A;    Family History  Problem Relation Age of Onset  . Colon cancer Mother   . Stroke Father   . Other Sister        overdose in her 90's  . Hypertension Daughter     Social History Social History   Tobacco Use  . Smoking status: Former Smoker    Last attempt to quit: 09/02/1998    Years since quitting: 19.1  . Smokeless tobacco: Never Used  Substance Use Topics  . Alcohol use: Yes    Alcohol/week: 0.6 oz    Types: 1 Cans of beer per week  . Drug use: Never    Current Outpatient Medications  Medication Sig Dispense Refill  . atorvastatin (LIPITOR) 80 MG tablet Take 1 tablet (80 mg total) by mouth daily. 30 tablet 1  . carvedilol (COREG) 6.25 MG tablet Take 1 tablet (6.25 mg total) by mouth 2 (two) times daily with a meal. 60 tablet 1  . ciprofloxacin (CIPRO) 500  MG tablet Take 1 tablet (500 mg total) by mouth 2 (two) times daily. 14 tablet 0  . glipiZIDE (GLUCOTROL) 5 MG tablet Take by mouth 2 (two) times daily before a meal.    . olmesartan (BENICAR) 20 MG tablet Take 1 tablet (20 mg total) by mouth daily. 30 tablet 6  . olmesartan (BENICAR) 40 MG tablet Take 20 mg by mouth daily.  6  . oxyCODONE (OXY IR/ROXICODONE) 5 MG immediate release tablet Take 5 mg by mouth every 4-6 hours PRN severe pain. 30 tablet 0  . potassium chloride SA (K-DUR,KLOR-CON) 20 MEQ tablet Take 1 tablet (20 mEq total) by mouth daily. Until evaluated by Cardiologist. 7 tablet 0  . traZODone (DESYREL) 50 MG tablet Take 200 mg by mouth at bedtime.   2  .  furosemide (LASIX) 40 MG tablet Take 1 tablet (40 mg total) by mouth daily. Until evaluated by Cardiologist. (Patient not taking: Reported on 10/21/2017) 7 tablet 0   No current facility-administered medications for this visit.     No Known Allergies  Review of Systems  No fever No angina Complaining of some ankle edema-was given some Lasix at the cardiology office recently. Slight increase in weight over the past 2 weeks No shortness of breath  BP (!) 144/77 (BP Location: Left Arm, Patient Position: Sitting, Cuff Size: Large)   Pulse 90   Resp 16   Ht  (1.702 m)   Wt 180 lb (81.6 kg)   SpO2 98% Comment: ON RA  BMI 28.19 kg/m  Physical Exam      Exam    General- alert and comfortable    Neck- no JVD, no cervical adenopathy palpable, no carotid bruit   Lungs- clear without rales, wheezes Sternal wound- well-healed and stable with some erythema over the lower aspect which is nontender   Cor- regular rate and rhythm, no murmur , gallop   Abdomen- soft, non-tender   Extremities - warm, non-tender, minimal edema   Neuro- oriented, appropriate, no focal weakness   Diagnostic Tests: Wound culture report personally reviewed  Impression: Cellulitis-superficial infection lower sternal incision with MRSA being grown from a swab of the skin.  Plan: By patient's report the skin looks significantly improved. We will add oral doxycycline 100 mg p.o. twice daily for 2 weeks and add Bactroban topically for 2 weeks.  Patient is given a prescription for Lasix 40 mg to take as needed once a day for ankle edema-7 tablets only.  Return in 10 days for wound check.  Mikey Bussing, MD [facility] [phone]

## 2017-10-30 ENCOUNTER — Other Ambulatory Visit: Payer: Self-pay

## 2017-10-30 ENCOUNTER — Other Ambulatory Visit: Payer: Self-pay | Admitting: *Deleted

## 2017-10-30 ENCOUNTER — Ambulatory Visit (INDEPENDENT_AMBULATORY_CARE_PROVIDER_SITE_OTHER): Payer: Self-pay | Admitting: Cardiothoracic Surgery

## 2017-10-30 ENCOUNTER — Encounter: Payer: Self-pay | Admitting: Cardiothoracic Surgery

## 2017-10-30 VITALS — BP 152/91 | HR 87 | Resp 16 | Ht 67.0 in | Wt 180.0 lb

## 2017-10-30 DIAGNOSIS — L089 Local infection of the skin and subcutaneous tissue, unspecified: Secondary | ICD-10-CM

## 2017-10-30 DIAGNOSIS — A4902 Methicillin resistant Staphylococcus aureus infection, unspecified site: Secondary | ICD-10-CM

## 2017-10-30 DIAGNOSIS — T148XXA Other injury of unspecified body region, initial encounter: Principal | ICD-10-CM

## 2017-10-30 DIAGNOSIS — I10 Essential (primary) hypertension: Secondary | ICD-10-CM

## 2017-10-30 DIAGNOSIS — Z951 Presence of aortocoronary bypass graft: Secondary | ICD-10-CM

## 2017-10-30 MED ORDER — MUPIROCIN 2 % EX OINT: 1.0000 "application " | TOPICAL_OINTMENT | Freq: Two times a day (BID) | CUTANEOUS | 0 refills | Status: DC

## 2017-10-30 MED ORDER — ATENOLOL 50 MG PO TABS: 50.0000 mg | ORAL_TABLET | Freq: Every day | ORAL | 0 refills | Status: DC

## 2017-10-30 MED ORDER — DOXYCYCLINE HYCLATE 100 MG PO TABS: 100.0000 mg | ORAL_TABLET | Freq: Two times a day (BID) | ORAL | 0 refills | Status: DC

## 2017-10-30 NOTE — Progress Notes (Signed)
PCP is Richardean Chimera, MD Referring Provider is Corky Crafts, MD  Chief Complaint  Patient presents with  . 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  He is currently being treated with oral doxycycline 100 mg p.o. twice daily and topical Bactroban.  The redness is improved, the swelling is almost resolved.  The area is nontender.  He remains active walking 20 minutes a day.  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  Clarified his antihypertensive medication-he was taking atenolol 50 mg daily prior to surgery.  He cannot tolerate ACE inhibitor because of cough.  Following surgery he was placed on low-dose carvedilol 6.25 mg twice daily but that has not controlled his blood pressure well.  He will now converted back to his atenolol dosing 50 mg daily.   Past Medical History:  Diagnosis Date  . Coronary artery disease    NSTEMI with significant  2 vessell disease ...bare metal stent to mid circumflex and  residual disease  on RCA ( 80%) diffuse. Normal LV function 02/05/09   . Diabetes mellitus without complication (HCC)   . Hypertension     Past Surgical History:  Procedure Laterality Date  . CORONARY ARTERY BYPASS GRAFT N/A 09/05/2017   Procedure: CORONARY ARTERY BYPASS GRAFTING (CABG) x four , using left internal mammary artery and right leg greater saphenous vein harvested endoscopically;  Surgeon: Kerin Perna, MD;  Location: [facility] OR;  Service: Open Heart Surgery;  Laterality: N/A;  . LEFT HEART CATH AND CORONARY ANGIOGRAPHY N/A 09/01/2017   Procedure: LEFT HEART CATH AND CORONARY ANGIOGRAPHY;  Surgeon: Corky Crafts, MD;  Location: [facility] INVASIVE CV LAB;   Service: Cardiovascular;  Laterality: N/A;  . TEE WITHOUT CARDIOVERSION N/A 09/05/2017   Procedure: TRANSESOPHAGEAL ECHOCARDIOGRAM (TEE);  Surgeon: Donata Clay, Theron Arista, MD;  Location: [facility] OR;  Service: Open Heart Surgery;  Laterality: N/A;    Family History  Problem Relation Age of Onset  . Colon cancer Mother   . Stroke Father   . Other Sister        overdose in her 65's  . Hypertension Daughter     Social History Social History   Tobacco Use  . Smoking status: Former Smoker    Last attempt to quit: 09/02/1998    Years since quitting: 19.1  . Smokeless tobacco: Never Used  Substance Use Topics  . Alcohol use: Yes    Alcohol/week: 0.6 oz    Types: 1 Cans of beer per week  . Drug use: Never    Current Outpatient Medications  Medication Sig Dispense Refill  . atorvastatin (LIPITOR) 80 MG tablet Take 1 tablet (80 mg total) by mouth daily. 30 tablet 1  . carvedilol (COREG) 6.25 MG tablet Take 1 tablet (6.25 mg total) by mouth 2 (two) times daily with a meal. 60 tablet 1  . doxycycline (VIBRA-TABS) 100 MG tablet Take 1 tablet (100 mg total) by mouth 2 (two) times daily for 14 days. 28 tablet 0  . glipiZIDE (GLUCOTROL) 5 MG tablet Take by mouth 2 (two) times daily before a meal.    . mupirocin ointment (BACTROBAN) 2 % Apply 1 application topically 2 (two) times daily.  To the sternal incision as directed 22 g 0  . potassium chloride SA (K-DUR,KLOR-CON) 20 MEQ tablet Take 1 tablet (20 mEq total) by mouth daily. Until evaluated by Cardiologist. 7 tablet 0  . traZODone (DESYREL) 50 MG tablet Take 200 mg by mouth at bedtime.   2  . furosemide (LASIX) 40 MG tablet Take 1 tablet (40 mg total) by mouth daily. Until evaluated by Cardiologist. (Patient not taking: Reported on 10/21/2017) 7 tablet 0  . olmesartan (BENICAR) 20 MG tablet Take 1 tablet (20 mg total) by mouth daily. (Patient not taking: Reported on 10/30/2017) 30 tablet 6   No current facility-administered medications for this visit.      No Known Allergies  Review of Systems  No fever No sternal drainage No popping or clicking sternal incision Ankle edema improved No symptoms of angina  BP (!) 152/91 (BP Location: Right Arm, Patient Position: Sitting, Cuff Size: Large)   Pulse 87   Resp 16   Ht  (1.702 m)   Wt 180 lb (81.6 kg)   SpO2 98% Comment: ON RA  BMI 28.19 kg/m  Physical Exam Mild-moderate erythema of the skin at the lower sternal incision, slowly improving Lungs clear Heart rate regular Sternal bone healing well  Diagnostic Tests: None  Impression: Slow improvement in superficial MRSA infection of lower sternal incision  Plan: Continue with oral doxycycline 100 mg p.o. twice daily and topical Bactroban with wound check in 2 weeks.  He does not lift more than 20 pounds until after July 1. He was given a return to work form beginning July 1. Mikey Bussing, MD [facility] [phone]

## 2017-11-04 NOTE — Progress Notes (Signed)
Cardiology Office Note   Date:  11/05/2017   ID:  Curtis Little, Curtis Little 05-08-1951, MRN [id number]  PCP:  Richardean Chimera, MD    No chief complaint on file. CAD   Wt Readings from Last 3 Encounters:  11/05/17 194 lb (88 kg)  10/30/17 180 lb (81.6 kg)  10/21/17 180 lb (81.6 kg)       History of Present Illness: Curtis Little is a 67 y.o. male  has a history of known CAD with prior NSTEMI in 2010 with BMS to the mid LCX.  A few weeks later, he had rotational atherectomy to the RCA with stent placement. He did well for many years.  On 09/01/2017 he presented to the ED with chest pain.  He ruled in for NSTEMI.  Cardiac catheterization was performed. This showed severe 3 vessel disease with severe progression of previous CAD. It was felt coronary bypass would be indicated and TCTS consult was requested. The patient was evaluated by Dr. Donata Clay who was in agreement the patient would benefit from bypass surgery. He was taken to the operating room on 09/05/2017. He underwent CABG x 4 utilizing LIMA to LAD, SVG to diagonal, SVG to OM1, and SVG to RCA. Post op course was uneventful while in the hospital - but noted to have issues with his incision and volume overload.   He had a mild infection on the chest wall which was treated with antibiotics.   He is walking regularly.  LE swelling is bothersome.  Wants to get back on Lasix.  He ran out a week ago.  Weight is up.   Denies : Chest pain. Dizziness. Nitroglycerin use. Orthopnea. Palpitations. Paroxysmal nocturnal dyspnea. Shortness of breath. Syncope.   Felt that CABG was difficult but he is recovering well.    Past Medical History:  Diagnosis Date  . Coronary artery disease    NSTEMI with significant  2 vessell disease ...bare metal stent to mid circumflex and  residual disease  on RCA ( 80%) diffuse. Normal LV function 02/05/09   . Diabetes mellitus without complication (HCC)   . Hypertension     Past Surgical History:    Procedure Laterality Date  . CORONARY ARTERY BYPASS GRAFT N/A 09/05/2017   Procedure: CORONARY ARTERY BYPASS GRAFTING (CABG) x four , using left internal mammary artery and right leg greater saphenous vein harvested endoscopically;  Surgeon: Kerin Perna, MD;  Location: [facility] OR;  Service: Open Heart Surgery;  Laterality: N/A;  . LEFT HEART CATH AND CORONARY ANGIOGRAPHY N/A 09/01/2017   Procedure: LEFT HEART CATH AND CORONARY ANGIOGRAPHY;  Surgeon: Corky Crafts, MD;  Location: [facility] INVASIVE CV LAB;  Service: Cardiovascular;  Laterality: N/A;  . TEE WITHOUT CARDIOVERSION N/A 09/05/2017   Procedure: TRANSESOPHAGEAL ECHOCARDIOGRAM (TEE);  Surgeon: Donata Clay, Theron Arista, MD;  Location: [facility] OR;  Service: Open Heart Surgery;  Laterality: N/A;     Current Outpatient Medications  Medication Sig Dispense Refill  . atenolol (TENORMIN) 50 MG tablet Take 1 tablet (50 mg total) by mouth daily. 90 tablet 0  . atorvastatin (LIPITOR) 80 MG tablet Take 1 tablet (80 mg total) by mouth daily. 30 tablet 1  . furosemide (LASIX) 40 MG tablet Take 1 tablet (40 mg total) by mouth daily. Until evaluated by Cardiologist. 7 tablet 0  . glipiZIDE (GLUCOTROL) 5 MG tablet Take by mouth 2 (two) times daily before a meal.    . mupirocin ointment (BACTROBAN) 2 % Apply 1 application topically  2 (two) times daily. To the sternal incision as directed 22 g 0  . potassium chloride SA (K-DUR,KLOR-CON) 20 MEQ tablet Take 1 tablet (20 mEq total) by mouth daily. Until evaluated by Cardiologist. 7 tablet 0  . traZODone (DESYREL) 50 MG tablet Take 200 mg by mouth at bedtime.   2  . TRESIBA FLEXTOUCH 200 UNIT/ML SOPN Inject 75 Units as directed daily.  6   No current facility-administered medications for this visit.     Allergies:   Patient has no known allergies.    Social History:  The patient  reports that he quit smoking about 19 years ago. He has never used smokeless tobacco. He reports that he drinks about 0.6 oz of alcohol per  week. He reports that he does not use drugs.   Family History:  The patient's family history includes Colon cancer in his mother; Hypertension in his daughter; Other in his sister; Stroke in his father.    ROS:  Please see the history of present illness.   Otherwise, review of systems are positive for mild skin infection on chest-resolving.   All other systems are reviewed and negative.    PHYSICAL EXAM: VS:  BP 110/64   Pulse 74   Ht 5\' 7"  (1.702 m)   Wt 194 lb (88 kg)   SpO2 96%   BMI 30.38 kg/m  , BMI Body mass index is 30.38 kg/m. GEN: Well nourished, well developed, in no acute distress  HEENT: normal  Neck: no JVD, carotid bruits, or masses Cardiac: RRR; no murmurs, rubs, or gallops,no edema  Respiratory:  clear to auscultation bilaterally, normal work of breathing GI: soft, nontender, nondistended, + BS MS: no deformity or atrophy ; chest incision healing well with som erythema at the bottom of the wound Skin: warm and dry, no rash Neuro:  Strength and sensation are intact Psych: euthymic mood, full affect    Recent Labs: 09/01/2017: B Natriuretic Peptide 965.6 09/02/2017: ALT 27; TSH 2.058 09/06/2017: Magnesium 2.0 09/30/2017: BUN 12; Creatinine, Ser 0.90; Hemoglobin 12.4; Platelets 338; Potassium 4.6; Sodium 139   Lipid Panel    Component Value Date/Time   CHOL 167 09/02/2017 0351   TRIG 142 09/02/2017 0351   HDL 34 (L) 09/02/2017 0351   CHOLHDL 4.9 09/02/2017 0351   VLDL 28 09/02/2017 0351   LDLCALC 105 (H) 09/02/2017 0351     Other studies Reviewed: Additional studies/ records that were reviewed today with results demonstrating: Labs from April 2019 reviewed.   ASSESSMENT AND PLAN:  1. CAD/Old MI: No angina on medical therapy.  He is back to walking.  Continue aggressive secondary prevention.  LE edema/volume overload to be treated with Lasix 40 mg daily ad potassium.  Will refill Lasix and check labs. 2. Diabetes mellitus: Healthy diet and regular exercise.   FOllowed by hos PMD.  3. Hyperlipidemia: Check fasting lipids in the near future.  Check liver tests as well. 4. Prior tobacco abuse: Avoid tobacco products.    Current medicines are reviewed at length with the patient today.  The patient concerns regarding his medicines were addressed.  The following changes have been made:  No change  Labs/ tests ordered today include: CMet No orders of the defined types were placed in this encounter.   Recommend 150 minutes/week of aerobic exercise Low fat, low carb, high fiber diet recommended  Disposition:   FU in 3-6 months   Signed, Lance MussJayadeep Ellisa Devivo, MD  11/05/2017 12:01 PM    [facility] [address] [place], [place]  [address] Phone: [phone]; Fax: [phone]

## 2017-11-05 ENCOUNTER — Ambulatory Visit: Payer: BC Managed Care – PPO | Admitting: Interventional Cardiology

## 2017-11-05 ENCOUNTER — Encounter: Payer: Self-pay | Admitting: Interventional Cardiology

## 2017-11-05 VITALS — BP 110/64 | HR 74 | Ht 67.0 in | Wt 194.0 lb

## 2017-11-05 DIAGNOSIS — E782 Mixed hyperlipidemia: Secondary | ICD-10-CM | POA: Diagnosis not present

## 2017-11-05 DIAGNOSIS — E1159 Type 2 diabetes mellitus with other circulatory complications: Secondary | ICD-10-CM

## 2017-11-05 DIAGNOSIS — Z720 Tobacco use: Secondary | ICD-10-CM

## 2017-11-05 DIAGNOSIS — I25118 Atherosclerotic heart disease of native coronary artery with other forms of angina pectoris: Secondary | ICD-10-CM | POA: Diagnosis not present

## 2017-11-05 MED ORDER — FUROSEMIDE 40 MG PO TABS: 40.0000 mg | ORAL_TABLET | Freq: Every day | ORAL | 3 refills | Status: DC

## 2017-11-05 NOTE — Patient Instructions (Addendum)
Medication Instructions:  Your physician recommends that you continue on your current medications as directed. Please refer to the Current Medication list given to you today.   Labwork: Your physician recommends that you have a FASTING lipid profile drawn at your primary care doctor's office and have them fax us the results  Your physician recommends that you return for lab work on the same day as your appointment with Dr. Donata ClayVan Trigt for a complete metabolic panel   Testing/Procedures: None ordered  Follow-Up: Your physician recommends that you schedule a follow-up appointment in: 3 months with Dr. Eldridge DaceVaranasi    Any Other Special Instructions Will Be Listed Below (If Applicable).     If you need a refill on your cardiac medications before your next appointment, please call your pharmacy.     Any Other Special Instructions Will Be Listed Below (If Applicable).     If you need a refill on your cardiac medications before your next appointment, please call your pharmacy.

## 2017-11-13 ENCOUNTER — Encounter: Payer: Self-pay | Admitting: Cardiothoracic Surgery

## 2017-11-13 ENCOUNTER — Ambulatory Visit (INDEPENDENT_AMBULATORY_CARE_PROVIDER_SITE_OTHER): Payer: Self-pay | Admitting: Cardiothoracic Surgery

## 2017-11-13 ENCOUNTER — Other Ambulatory Visit: Payer: Self-pay | Admitting: Physician Assistant

## 2017-11-13 ENCOUNTER — Other Ambulatory Visit: Payer: Self-pay

## 2017-11-13 ENCOUNTER — Other Ambulatory Visit: Payer: BC Managed Care – PPO | Admitting: *Deleted

## 2017-11-13 VITALS — BP 153/89 | HR 69 | Resp 16 | Ht 67.0 in | Wt 190.0 lb

## 2017-11-13 DIAGNOSIS — Z951 Presence of aortocoronary bypass graft: Secondary | ICD-10-CM

## 2017-11-13 DIAGNOSIS — E1159 Type 2 diabetes mellitus with other circulatory complications: Secondary | ICD-10-CM

## 2017-11-13 DIAGNOSIS — I25118 Atherosclerotic heart disease of native coronary artery with other forms of angina pectoris: Secondary | ICD-10-CM

## 2017-11-13 DIAGNOSIS — Z720 Tobacco use: Secondary | ICD-10-CM

## 2017-11-13 DIAGNOSIS — E782 Mixed hyperlipidemia: Secondary | ICD-10-CM

## 2017-11-13 NOTE — Progress Notes (Signed)
PCP is Richardean Chimeraaniel, Terry G, MD Referring Provider is Corky CraftsVaranasi, Jayadeep S, MD  Chief Complaint  Patient presents with  . Coronary Artery Disease    s/p CABG x4, 09/05/2017  . Wound Check    sternal wound dehiscence    HPI: Patient returns for a final wound check.  He has had superficial inflammation as infection of the lower sternal incision with cellulitis, no drainage.  He has been treated with a extended course of oral doxycycline.  He is now off.  The incision now looks completely healed.  There is no tenderness or warmth.  The entire incision is somewhat erythematous possibly related to the reabsorbable suture material.   Past Medical History:  Diagnosis Date  . Coronary artery disease    NSTEMI with significant  2 vessell disease ...bare metal stent to mid circumflex and  residual disease  on RCA ( 80%) diffuse. Normal LV function 02/05/09   . Diabetes mellitus without complication (HCC)   . Hypertension     Past Surgical History:  Procedure Laterality Date  . CORONARY ARTERY BYPASS GRAFT N/A 09/05/2017   Procedure: CORONARY ARTERY BYPASS GRAFTING (CABG) x four , using left internal mammary artery and right leg greater saphenous vein harvested endoscopically;  Surgeon: Kerin PernaVan Trigt, Mayline Dragon, MD;  [facility];  Service: Open Heart Surgery;  Laterality: N/A;  . LEFT HEART CATH AND CORONARY ANGIOGRAPHY N/A 09/01/2017   Procedure: LEFT HEART CATH AND CORONARY ANGIOGRAPHY;  Surgeon: Corky CraftsVaranasi, Jayadeep S, MD;  [facility];  Service: Cardiovascular;  Laterality: N/A;  . TEE WITHOUT CARDIOVERSION N/A 09/05/2017   Procedure: TRANSESOPHAGEAL ECHOCARDIOGRAM (TEE);  Surgeon: Donata ClayVan Trigt, Theron AristaPeter, MD;  [facility];  Service: Open Heart Surgery;  Laterality: N/A;    Family History  Problem Relation Age of Onset  . Colon cancer Mother   . Stroke Father   . Other Sister        overdose in her 4040's  . Hypertension Daughter     Social History Social History   Tobacco Use  .  Smoking status: Former Smoker    Last attempt to quit: 09/02/1998    Years since quitting: 19.2  . Smokeless tobacco: Never Used  Substance Use Topics  . Alcohol use: Yes    Alcohol/week: 0.6 oz    Types: 1 Cans of beer per week  . Drug use: Never    Current Outpatient Medications  Medication Sig Dispense Refill  . atenolol (TENORMIN) 50 MG tablet Take 1 tablet (50 mg total) by mouth daily. 90 tablet 0  . atorvastatin (LIPITOR) 80 MG tablet Take 1 tablet (80 mg total) by mouth daily. 30 tablet 1  . furosemide (LASIX) 40 MG tablet Take 1 tablet (40 mg total) by mouth daily. 90 tablet 3  . glipiZIDE (GLUCOTROL) 5 MG tablet Take by mouth 2 (two) times daily before a meal.    . mupirocin ointment (BACTROBAN) 2 % Apply 1 application topically 2 (two) times daily. To the sternal incision as directed 22 g 0  . potassium chloride SA (K-DUR,KLOR-CON) 20 MEQ tablet Take 1 tablet (20 mEq total) by mouth daily. Until evaluated by Cardiologist. 7 tablet 0  . traZODone (DESYREL) 50 MG tablet Take 200 mg by mouth at bedtime.   2  . TRESIBA FLEXTOUCH 200 UNIT/ML SOPN Inject 75 Units as directed daily.  6   No current facility-administered medications for this visit.     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

## 2017-11-14 ENCOUNTER — Telehealth: Payer: Self-pay | Admitting: Interventional Cardiology

## 2017-11-14 ENCOUNTER — Other Ambulatory Visit: Payer: Self-pay

## 2017-11-14 LAB — COMPREHENSIVE METABOLIC PANEL
ALK PHOS: 73 IU/L (ref 39–117)
ALT: 46 IU/L — AB (ref 0–44)
AST: 27 IU/L (ref 0–40)
Albumin/Globulin Ratio: 1.4 (ref 1.2–2.2)
Albumin: 4.3 g/dL (ref 3.6–4.8)
BILIRUBIN TOTAL: 0.5 mg/dL (ref 0.0–1.2)
BUN/Creatinine Ratio: 16 (ref 10–24)
BUN: 15 mg/dL (ref 8–27)
CHLORIDE: 97 mmol/L (ref 96–106)
CO2: 25 mmol/L (ref 20–29)
Calcium: 9.5 mg/dL (ref 8.6–10.2)
Creatinine, Ser: 0.93 mg/dL (ref 0.76–1.27)
GFR calc non Af Amer: 85 mL/min/{1.73_m2} (ref >59)
GFR, EST AFRICAN AMERICAN: 99 mL/min/{1.73_m2} (ref >59)
GLUCOSE: 102 mg/dL — AB (ref 65–99)
Globulin, Total: 3 g/dL (ref 1.5–4.5)
Potassium: 4.2 mmol/L (ref 3.5–5.2)
Sodium: 140 mmol/L (ref 134–144)
TOTAL PROTEIN: 7.3 g/dL (ref 6.0–8.5)

## 2017-11-14 MED ORDER — ATORVASTATIN CALCIUM 80 MG PO TABS: 80.0000 mg | ORAL_TABLET | Freq: Every day | ORAL | 3 refills | Status: DC

## 2017-11-14 NOTE — Telephone Encounter (Signed)
LM to [facility]

## 2017-11-14 NOTE — Telephone Encounter (Signed)
New Message    *STAT* If patient is at the pharmacy, call can be transferred to refill team.   1. Which medications need to be refilled? (please list name of each medication and dose if known) atorvastatin (LIPITOR) 80 MG tablet  2. Which pharmacy/location (including street and city if local pharmacy) is medication to be sent to? [facility] [address] - [place], [place] - [address]  3. Do they need a 30 day or 90 day supply? 90    .

## 2017-11-14 NOTE — Addendum Note (Signed)
Addended by: Michaelle CopasBAKER, Rodriques Badie on: 11/14/2017 05:12 PM   Modules accepted: Orders

## 2018-01-29 ENCOUNTER — Other Ambulatory Visit: Payer: Self-pay | Admitting: Cardiothoracic Surgery

## 2018-01-29 DIAGNOSIS — I10 Essential (primary) hypertension: Secondary | ICD-10-CM

## 2018-02-01 ENCOUNTER — Other Ambulatory Visit: Payer: Self-pay | Admitting: Cardiothoracic Surgery

## 2018-02-01 DIAGNOSIS — I10 Essential (primary) hypertension: Secondary | ICD-10-CM

## 2018-02-04 ENCOUNTER — Telehealth: Payer: Self-pay | Admitting: Interventional Cardiology

## 2018-02-04 DIAGNOSIS — I10 Essential (primary) hypertension: Secondary | ICD-10-CM

## 2018-02-04 MED ORDER — ATENOLOL 50 MG PO TABS: 50.0000 mg | ORAL_TABLET | Freq: Every day | ORAL | 3 refills | Status: DC

## 2018-02-04 NOTE — Telephone Encounter (Signed)
Refill sent in

## 2018-02-04 NOTE — Telephone Encounter (Signed)
Pt's pharmacy [facility] in [place] is requesting a refill on Atenolol 50 mg tablet. Would Dr. Eldridge Dace like to refill this medication. Please address

## 2018-02-06 ENCOUNTER — Encounter: Payer: Self-pay | Admitting: Interventional Cardiology

## 2018-02-20 ENCOUNTER — Ambulatory Visit: Payer: BC Managed Care – PPO | Admitting: Interventional Cardiology

## 2018-04-26 ENCOUNTER — Other Ambulatory Visit: Payer: Self-pay | Admitting: Nurse Practitioner

## 2018-05-22 ENCOUNTER — Ambulatory Visit: Payer: BC Managed Care – PPO | Admitting: Interventional Cardiology

## 2018-06-19 ENCOUNTER — Encounter: Payer: Self-pay | Admitting: Cardiology

## 2018-06-19 ENCOUNTER — Ambulatory Visit: Payer: BC Managed Care – PPO | Admitting: Medical

## 2018-06-19 VITALS — BP 124/62 | HR 61 | Ht 67.0 in | Wt 198.8 lb

## 2018-06-19 DIAGNOSIS — I25118 Atherosclerotic heart disease of native coronary artery with other forms of angina pectoris: Secondary | ICD-10-CM

## 2018-06-19 DIAGNOSIS — Z951 Presence of aortocoronary bypass graft: Secondary | ICD-10-CM

## 2018-06-19 DIAGNOSIS — E1159 Type 2 diabetes mellitus with other circulatory complications: Secondary | ICD-10-CM

## 2018-06-19 DIAGNOSIS — E782 Mixed hyperlipidemia: Secondary | ICD-10-CM

## 2018-06-19 DIAGNOSIS — I10 Essential (primary) hypertension: Secondary | ICD-10-CM | POA: Diagnosis not present

## 2018-06-19 LAB — BASIC METABOLIC PANEL
BUN/Creatinine Ratio: 20 (ref 10–24)
BUN: 23 mg/dL (ref 8–27)
CALCIUM: 9.5 mg/dL (ref 8.6–10.2)
CHLORIDE: 97 mmol/L (ref 96–106)
CO2: 24 mmol/L (ref 20–29)
CREATININE: 1.17 mg/dL (ref 0.76–1.27)
GFR calc Af Amer: 75 mL/min/{1.73_m2} (ref >59)
GFR, EST NON AFRICAN AMERICAN: 65 mL/min/{1.73_m2} (ref >59)
Glucose: 117 mg/dL — ABNORMAL HIGH (ref 65–99)
POTASSIUM: 4.3 mmol/L (ref 3.5–5.2)
SODIUM: 138 mmol/L (ref 134–144)

## 2018-06-19 NOTE — Progress Notes (Signed)
Cardiology Office Note   Date:  06/19/2018   ID:  Curtis Little, Curtis Little, Curtis Little, MRN [id number]  PCP:  Richardean Chimera, MD  Cardiologist:  Lance Muss, MD EP: None  Chief Complaint  Patient presents with  . Follow-up    CAD      History of Present Illness: Curtis Little is a 68 y.o. male with PMH of CAD s/p NSTEMI in 2010 with BMS to [facility] and atherectomy to RCA with stent placement, subsequent NSTEMI 08/2017 found to have severe 3 vessel CAD s/p CABG, HTN, HLD, DM type 2, who presents for routine follow-up of his CAD.   Patient was last see by cardiology, Dr. Eldridge Dace, 11/2017 at which time he had complaints of LE swelling and was requesting lasix. No other cardiac complaints at that time. He was prescribed lasix 40mg  daily with potassium.  Echo 09/2017 with EF 50-55%, moderate LVH, normal wall motion, and G1DD.   He returns today for routine follow-up. He reports doing well from a cardiac standpoint. His PCP has been adjusting his blood pressure medications and since his last visit he was started on losartan 100mg  daily, HCTZ 12.5mg  daily, as well as olmesartan 20mg  daily (although he has been out of this medication x1 month). He reports improvement in LE edema since his last visit with daily lasix use. He denies chest pain, SOB, DOE, orthopnea, PND, worsening of LE edema, dizziness, lightheadedness, or syncope.     Past Medical History:  Diagnosis Date  . Coronary artery disease    NSTEMI with significant  2 vessell disease ...bare metal stent to mid circumflex and  residual disease  on RCA ( 80%) diffuse. Normal LV function 02/05/09   . Diabetes mellitus without complication (HCC)   . Hypertension     Past Surgical History:  Procedure Laterality Date  . CORONARY ARTERY BYPASS GRAFT N/A 09/05/2017   Procedure: CORONARY ARTERY BYPASS GRAFTING (CABG) x four , using left internal mammary artery and right leg greater saphenous vein harvested endoscopically;  Surgeon: Kerin Perna, MD;  Location: [facility] OR;  Service: Open Heart Surgery;  Laterality: N/A;  . LEFT HEART CATH AND CORONARY ANGIOGRAPHY N/A 09/01/2017   Procedure: LEFT HEART CATH AND CORONARY ANGIOGRAPHY;  Surgeon: Corky Crafts, MD;  Location: [facility] INVASIVE CV LAB;  Service: Cardiovascular;  Laterality: N/A;  . TEE WITHOUT CARDIOVERSION N/A 09/05/2017   Procedure: TRANSESOPHAGEAL ECHOCARDIOGRAM (TEE);  Surgeon: Donata Clay, Theron Arista, MD;  Location: [facility] OR;  Service: Open Heart Surgery;  Laterality: N/A;     Current Outpatient Medications  Medication Sig Dispense Refill  . aspirin EC 81 MG tablet Take 81 mg by mouth daily.    Marland Kitchen atenolol (TENORMIN) 50 MG tablet Take 1 tablet (50 mg total) by mouth daily. 90 tablet 3  . atorvastatin (LIPITOR) 80 MG tablet Take 1 tablet (80 mg total) by mouth daily. 90 tablet 3  . furosemide (LASIX) 40 MG tablet Take 1 tablet (40 mg total) by mouth daily. 90 tablet 3  . hydrochlorothiazide (MICROZIDE) 12.5 MG capsule Take 12.5 mg by mouth daily.    Marland Kitchen losartan (COZAAR) 100 MG tablet Take 100 mg by mouth daily.    . mupirocin ointment (BACTROBAN) 2 % Apply 1 application topically 2 (two) times daily. To the sternal incision as directed 22 g 0  . TRESIBA FLEXTOUCH 200 UNIT/ML SOPN Inject 75 Units as directed daily.  6  . glipiZIDE (GLUCOTROL) 5 MG tablet Take by mouth 2 (  two) times daily before a meal.     No current facility-administered medications for this visit.     Allergies:   Patient has no known allergies.    Social History:  The patient  reports that he quit smoking about 19 years ago. He has never used smokeless tobacco. He reports current alcohol use of about 1.0 standard drinks of alcohol per week. He reports that he does not use drugs.   Family History:  The patient's family history includes Colon cancer in his mother; Hypertension in his daughter; Other in his sister; Stroke in his father.    ROS:  Please see the history of present illness.   Otherwise,  review of systems are positive for none.   All other systems are reviewed and negative.    PHYSICAL EXAM: VS:  BP 124/62   Pulse 61   Ht 5\' 7"  (1.702 m)   Wt 198 lb 12.8 oz (90.2 kg)   SpO2 95%   BMI 31.14 kg/m  , BMI Body mass index is 31.14 kg/m. GEN: Well nourished, well developed, in no acute distress HEENT: sclera anicteric Neck: no JVD, carotid bruits, or masses Cardiac: RRR; no murmurs, rubs, or gallops,no edema  Respiratory:  clear to auscultation bilaterally, normal work of breathing GI: soft, nontender, nondistended, + BS MS: no deformity or atrophy Skin: warm and dry, no rash Neuro:  Strength and sensation are intact Psych: euthymic mood, full affect   EKG:  EKG is not ordered today.    Recent Labs: 09/01/2017: B Natriuretic Peptide 965.6 09/02/2017: TSH 2.058 09/06/2017: Magnesium 2.0 09/30/2017: Hemoglobin 12.4; Platelets 338 11/13/2017: ALT 46; BUN 15; Creatinine, Ser 0.93; Potassium 4.2; Sodium 140    Lipid Panel    Component Value Date/Time   CHOL 167 09/02/2017 0351   TRIG 142 09/02/2017 0351   HDL 34 (L) 09/02/2017 0351   CHOLHDL 4.9 09/02/2017 0351   VLDL 28 09/02/2017 0351   LDLCALC 105 (H) 09/02/2017 0351      Wt Readings from Last 3 Encounters:  06/19/18 198 lb 12.8 oz (90.2 kg)  11/13/17 190 lb (86.2 kg)  11/05/17 194 lb (88 kg)      Other studies Reviewed: Additional studies/ records that were reviewed today include: .  Left heart catheterization 09/01/17:  Ost Cx to Mid Cx lesion is 100% stenosed. There are left to left collaterals.  Mid Cx to Dist Cx lesion is 25% stenosed.  Ost LM to Mid LM lesion is 50% stenosed.  Ost LAD to Prox LAD lesion is 75% stenosed.  Ost 1st Diag to 1st Diag lesion is 75% stenosed.  Prox LAD to Mid LAD lesion is 60% stenosed.  Mid RCA lesion is 25% instent restenosis in the proximal portion of the Taxus stent.  Prox RCA to Mid RCA lesion is 90% stenosed.  Dist RCA lesion is 75% stenosed.  The  left ventricular ejection fraction is 35-45% by visual estimate.  There is mild to moderate left ventricular systolic dysfunction.  LV end diastolic pressure is mildly elevated.  There is no aortic valve stenosis.   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  Severe three vessel CAD.  Plan for cardiac surgery referral.   Medical therapy for LV dysfunction.    Echocardiogram 09/02/17: Study Conclusions  - Left ventricle: Wall thickness was increased in a pattern of   moderate LVH. Systolic function was normal. The estimated   ejection fraction was in the range  of 50% to 55%. Wall motion was   normal; there were no regional wall motion abnormalities. Doppler   parameters are consistent with abnormal left ventricular   relaxation (grade 1 diastolic dysfunction).  ASSESSMENT AND PLAN:  1. CAD s/p CABG 09/2017: patient presents for routine follow-up. No anginal complaints.  - Continue aspirin and atorvastatin - Continue atenolol  2. HTN: BP well controlled today. He had been on 2 ARB medications however ran out of his olmesartan 1 month ago.  - Will discontinue olmesartan and continue losartan at this time - Continue atenolol, HCTZ, and lasix - Will check a BMET to ensure kidney function and potassium levels are stable   3. HLD: LDL 76 03/2018; goal <70 - Continue atorvastatin 80mg  daily - Can recheck FLP at next follow-up visit - if persistently above goal, could consider adding zetia  4. DM type 2: HgbA1C 6.7 03/2018; at goal of <7 - Continue management per PCP   Current medicines are reviewed at length with the patient today.  The patient does not have concerns regarding medicines.  The following changes have been made:  Stop olmesartan  Labs/ tests ordered today include:   Orders Placed This Encounter  Procedures  . Basic metabolic panel     Disposition:   FU with Dr. Eldridge DaceVaranasi in 6 months  Signed, Beatriz StallionKrista M. Yazmyne Sara, PA-C    06/19/2018 8:38 AM

## 2018-06-19 NOTE — Patient Instructions (Signed)
Medication Instructions:  1.) STOP: Olmesartan   If you need a refill on your cardiac medications before your next appointment, please call your pharmacy.   Lab work: TODAY: BMET  If you have labs (blood work) drawn today and your tests are completely normal, you will receive your results only by: Marland Kitchen MyChart Message (if you have MyChart) OR . A paper copy in the mail If you have any lab test that is abnormal or we need to change your treatment, we will call you to review the results.  Testing/Procedures: None  Follow-Up: At [facility], you and your health needs are our priority.  As part of our continuing mission to provide you with exceptional heart care, we have created designated Provider Care Teams.  These Care Teams include your primary Cardiologist (physician) and Advanced Practice Providers (APPs -  Physician Assistants and Nurse Practitioners) who all work together to provide you with the care you need, when you need it. You will need a follow up appointment in 6 months.  Please call our office 2 months in advance to schedule this appointment.  You may see Lance Muss, MD or one of the following Advanced Practice Providers on your designated Care Team:   [place], PA-C Ronie Spies, PA-C . Jacolyn Reedy, PA-C  Any Other Special Instructions Will Be Listed Below (If Applicable).

## 2018-11-17 ENCOUNTER — Other Ambulatory Visit: Payer: Self-pay | Admitting: Interventional Cardiology

## 2018-12-13 ENCOUNTER — Other Ambulatory Visit: Payer: Self-pay | Admitting: Interventional Cardiology

## 2018-12-21 ENCOUNTER — Encounter: Payer: Self-pay | Admitting: Physician Assistant

## 2018-12-21 NOTE — Progress Notes (Signed)
Cardiology Office Note    Date:  12/24/2018  ID:  Curtis Little, DOB [birth year], MRN [id number] PCP:  Richardean Chimeraaniel, Terry G, MD  Cardiologist:  Lance MussJayadeep Varanasi, MD   Chief Complaint: f/u CAD, swelling  History of Present Illness:  Curtis Little is a 68 y.o. male with history of CAD ([facility]stent to RCA, NSTEMI s/p CABG 09/2017), HTN, HLD, DM type 2, probable chronic diastolic CHF who presents for routine follow-up of his CAD.   Last echo was in 09/2017 around time of CABG showing EF 50-55%, moderate LVH, normal wall motion, and G1DD. He was started on Lasix in 11/2017 and saw Judy PimpleKrista Kroeger back in f/u 06/2018 and was doing well. ARB was adjusted as he was on duplicate therapy. Last labs including KPN 08/2018 showed A1C 6.8, albumin 4.0, AST/ALT wnl, BUN 17, Cr 1.040, Tchol 153, HDL 34, LDL 48, trig 354, TSH wnl, 06/2018 Na 138, K 4.3.  He returns for follow-up feeling well. He denies any chest pain, SOB, swelling, orthopnea, PND. He has gained weight but acknowledges general sedentary lifestyle. He has no symptoms to suggest this is worsening of CHF. BP is well controlled. He ran out of Lasix a few days ago and noticed some edema but this has resolved with resumption.   Past Medical History:  Diagnosis Date  . Chronic diastolic CHF (congestive heart failure) (HCC)   . Coronary artery disease    a. NSTEMI in 2010 s/p BMS to [facility]LCx and atherectomy/stent to RCA. b. NSTEMI s/p CABG 09/2017 - LIMA to LAD, SVG to DIAGONAL, SVG to OM, SVG to RCA.  . Diabetes mellitus (HCC)   . Hyperlipidemia   . Hypertension   . Hypertensive heart disease   . LVH (left ventricular hypertrophy)     Past Surgical History:  Procedure Laterality Date  . CORONARY ARTERY BYPASS GRAFT N/A 09/05/2017   Procedure: CORONARY ARTERY BYPASS GRAFTING (CABG) x four , using left internal mammary artery and right leg greater saphenous vein harvested endoscopically;  Surgeon: Kerin PernaVan Trigt, Peter,  MD;  [facility];  Service: Open Heart Surgery;  Laterality: N/A;  . LEFT HEART CATH AND CORONARY ANGIOGRAPHY N/A 09/01/2017   Procedure: LEFT HEART CATH AND CORONARY ANGIOGRAPHY;  Surgeon: Corky CraftsVaranasi, Jayadeep S, MD;  [facility];  Service: Cardiovascular;  Laterality: N/A;  . TEE WITHOUT CARDIOVERSION N/A 09/05/2017   Procedure: TRANSESOPHAGEAL ECHOCARDIOGRAM (TEE);  Surgeon: Donata ClayVan Trigt, Theron AristaPeter, MD;  Location: [facility] OR;  Service: Open Heart Surgery;  Laterality: N/A;    Current Medications: Current Meds  Medication Sig  . aspirin EC 81 MG tablet Take 81 mg by mouth daily.  Marland Kitchen. atenolol (TENORMIN) 50 MG tablet Take 1 tablet (50 mg total) by mouth daily.  Marland Kitchen. atorvastatin (LIPITOR) 80 MG tablet Take 1 tablet by mouth once daily  . furosemide (LASIX) 40 MG tablet Take 1 tablet by mouth once daily  . glipiZIDE (GLUCOTROL) 5 MG tablet Take by mouth 2 (two) times daily before a meal.  . hydrochlorothiazide (MICROZIDE) 12.5 MG capsule Take 12.5 mg by mouth daily.  Marland Kitchen. losartan (COZAAR) 100 MG tablet Take 100 mg by mouth daily.  . TRESIBA FLEXTOUCH 200 UNIT/ML SOPN Inject 75 Units as directed daily.     Allergies:   Patient has no known allergies.   Social History   Socioeconomic History  . Marital status: Married    Spouse name: Not on file  . Number of  children: Not on file  . Years of education: Not on file  . Highest education level: Not on file  Occupational History  . Not on file  Social Needs  . Financial resource strain: Not on file  . Food insecurity    Worry: Not on file    Inability: Not on file  . Transportation needs    Medical: Not on file    Non-medical: Not on file  Tobacco Use  . Smoking status: Former Smoker    Quit date: 09/02/1998    Years since quitting: 20.3  . Smokeless tobacco: Never Used  Substance and Sexual Activity  . Alcohol use: Yes    Alcohol/week: 1.0 standard drinks    Types: 1 Cans of beer per week  . Drug use: Never  . Sexual  activity: Not on file  Lifestyle  . Physical activity    Days per week: Not on file    Minutes per session: Not on file  . Stress: Not on file  Relationships  . Social Herbalist on phone: Not on file    Gets together: Not on file    Attends religious service: Not on file    Active member of club or organization: Not on file    Attends meetings of clubs or organizations: Not on file    Relationship status: Not on file  Other Topics Concern  . Not on file  Social History Narrative  . Not on file     Family History:  The patient's family history includes Colon cancer in his mother; Hypertension in his daughter; Other in his sister; Stroke in his father.  ROS:   Please see the history of present illness.   All other systems are reviewed and otherwise negative.    PHYSICAL EXAM:   VS:  BP 120/72   Pulse 61   Ht 5\' 7"  (1.702 m)   Wt 207 lb 12.8 oz (94.3 kg)   SpO2 97%   BMI 32.55 kg/m   BMI: Body mass index is 32.55 kg/m. GEN: Well nourished, well developed WM, in no acute distress HEENT: normocephalic, atraumatic Neck: no JVD, carotid bruits, or masses Cardiac: RRR; no murmurs, rubs, or gallops, no edema  Respiratory:  clear to auscultation bilaterally, normal work of breathing GI: soft, nontender, nondistended, + BS MS: no deformity or atrophy Skin: warm and dry, no rash Neuro:  Alert and Oriented x 3, Strength and sensation are intact, follows commands Psych: euthymic mood, full affect  Wt Readings from Last 3 Encounters:  12/24/18 207 lb 12.8 oz (94.3 kg)  06/19/18 198 lb 12.8 oz (90.2 kg)  11/13/17 190 lb (86.2 kg)      Studies/Labs Reviewed:   EKG:  EKG was ordered today and personally reviewed by me and demonstrates NSR 61bpm, upsloped inferior ST segments in III avF with Q wave III, nonspecific STT changes I, avL, V5-V6 - appears to be evolution of prior MI from 09/2017. Reviewed with Dr. Irish Lack.  Recent Labs: 06/19/2018: BUN 23; Creatinine,  Ser 1.17; Potassium 4.3; Sodium 138   Lipid Panel    Component Value Date/Time   CHOL 167 09/02/2017 0351   TRIG 142 09/02/2017 0351   HDL 34 (L) 09/02/2017 0351   CHOLHDL 4.9 09/02/2017 0351   VLDL 28 09/02/2017 0351   LDLCALC 105 (H) 09/02/2017 0351    Additional studies/ records that were reviewed today include: Summarized above  ASSESSMENT & PLAN:   1. CAD - doing well.  Continue ASA, BB, statin. See below regarding lipids. 2. Hypertensive heart disease/chronic diastolic CHF - fluid status controlled on Lasix. Reviewed 2g sodium restriction, 2L fluid restriction, daily weights with patient (CHF sheet given). Given that he is on Lasix + HCTZ, will update BMET to obtain potassium level today. Given his chronic ASA use will also update CBC with labs. 3. Essential HTN - BP well controlled on present regimen. 4. Hyperlipidemia with hypertriglyceridemia on labs in 08/2018 - trigs 354 despite A1C 6.8 so may need additional pharmacologic therapy. Refer to lipid clinic for assistance/consideration of Vascepa.  Disposition: F/u with Dr. Eldridge DaceVaranasi in 6 months.   Medication Adjustments/Labs and Tests Ordered: Current medicines are reviewed at length with the patient today.  Concerns regarding medicines are outlined above. Medication changes, Labs and Tests ordered today are summarized above and listed in the Patient Instructions accessible in Encounters.   Signed, Laurann Montanaayna N Dunn, PA-C  12/24/2018 9:02 AM    [facility] [address], [place], [place]  [address] Phone: [phone]; Fax: [phone]

## 2018-12-23 ENCOUNTER — Telehealth: Payer: Self-pay | Admitting: Physician Assistant

## 2018-12-23 NOTE — Telephone Encounter (Signed)
° °  Follow- up:  Patient called back returning Merissa's call about pre-screening questions.

## 2018-12-23 NOTE — Telephone Encounter (Signed)

## 2018-12-23 NOTE — Telephone Encounter (Signed)
New Message ° ° ° °Left message to confirm appt and answer covid questions  °

## 2018-12-24 ENCOUNTER — Ambulatory Visit: Payer: BC Managed Care – PPO | Admitting: Physician Assistant

## 2018-12-24 ENCOUNTER — Other Ambulatory Visit: Payer: Self-pay

## 2018-12-24 ENCOUNTER — Encounter: Payer: Self-pay | Admitting: Physician Assistant

## 2018-12-24 VITALS — BP 120/72 | HR 61 | Ht 67.0 in | Wt 207.8 lb

## 2018-12-24 DIAGNOSIS — I10 Essential (primary) hypertension: Secondary | ICD-10-CM | POA: Diagnosis not present

## 2018-12-24 DIAGNOSIS — I2510 Atherosclerotic heart disease of native coronary artery without angina pectoris: Secondary | ICD-10-CM

## 2018-12-24 DIAGNOSIS — E785 Hyperlipidemia, unspecified: Secondary | ICD-10-CM | POA: Diagnosis not present

## 2018-12-24 DIAGNOSIS — I5032 Chronic diastolic (congestive) heart failure: Secondary | ICD-10-CM | POA: Diagnosis not present

## 2018-12-24 LAB — BASIC METABOLIC PANEL
BUN/Creatinine Ratio: 13 (ref 10–24)
BUN: 15 mg/dL (ref 8–27)
CO2: 25 mmol/L (ref 20–29)
Calcium: 9.9 mg/dL (ref 8.6–10.2)
Chloride: 96 mmol/L (ref 96–106)
Creatinine, Ser: 1.12 mg/dL (ref 0.76–1.27)
GFR calc Af Amer: 78 mL/min/{1.73_m2} (ref >59)
GFR calc non Af Amer: 68 mL/min/{1.73_m2} (ref >59)
Glucose: 133 mg/dL — ABNORMAL HIGH (ref 65–99)
Potassium: 4.3 mmol/L (ref 3.5–5.2)
Sodium: 139 mmol/L (ref 134–144)

## 2018-12-24 LAB — CBC
Hematocrit: 44.3 % (ref 37.5–51.0)
Hemoglobin: 15.6 g/dL (ref 13.0–17.7)
MCH: 32.7 pg (ref 26.6–33.0)
MCHC: 35.2 g/dL (ref 31.5–35.7)
MCV: 93 fL (ref 79–97)
Platelets: 234 10*3/uL (ref 150–450)
RBC: 4.77 x10E6/uL (ref 4.14–5.80)
RDW: 12.8 % (ref 11.6–15.4)
WBC: 9.7 10*3/uL (ref 3.4–10.8)

## 2018-12-24 NOTE — Patient Instructions (Signed)
Medication Instructions:  Your physician recommends that you continue on your current medications as directed. Please refer to the Current Medication list given to you today.  If you need a refill on your cardiac medications before your next appointment, please call your pharmacy.   Lab work: TODAY:  BMET & CBC  If you have labs (blood work) drawn today and your tests are completely normal, you will receive your results only by: Marland Kitchen MyChart Message (if you have MyChart) OR . A paper copy in the mail If you have any lab test that is abnormal or we need to change your treatment, we will call you to review the results.  Testing/Procedures: None ordered   You have been referred to Lipid Clinic for Cholesterol Management.  Follow-Up: At [facility], you and your health needs are our priority.  As part of our continuing mission to provide you with exceptional heart care, we have created designated Provider Care Teams.  These Care Teams include your primary Cardiologist (physician) and Advanced Practice Providers (APPs -  Physician Assistants and Nurse Practitioners) who all work together to provide you with the care you need, when you need it. You will need a follow up appointment in 6 months.  Please call our office 2 months in advance to schedule this appointment.  You may see Larae Grooms, MD or one of the following Advanced Practice Providers on your designated Care Team:   [place], PA-C Melina Copa, PA-C . Ermalinda Barrios, PA-C  Any Other Special Instructions Will Be Listed Below (If Applicable).

## 2018-12-25 ENCOUNTER — Telehealth: Payer: Self-pay | Admitting: *Deleted

## 2018-12-25 NOTE — Progress Notes (Signed)
Pt has been made aware of normal result and verbalized understanding.  jw 12/25/2018

## 2018-12-25 NOTE — Telephone Encounter (Signed)
Returned pt's call and he has been made aware of his lab results.  

## 2018-12-25 NOTE — Telephone Encounter (Signed)
-----   Message from Charlie Pitter, [place] sent at 12/24/2018  4:47 PM EDT ----- Please let patient know labs look stable over the last 6 months. Potassium level is normal. Blood sugar elevated consistent with known DM. Blood count normal. Dayna Dunn PA-C

## 2018-12-25 NOTE — Telephone Encounter (Signed)
Call placed to pt to discuss his recent lab results, left a message for pt to call back.

## 2018-12-25 NOTE — Telephone Encounter (Signed)
-----   Message from Dayna N Dunn, PA-C sent at 12/24/2018  4:47 PM EDT ----- Please let patient know labs look stable over the last 6 months. Potassium level is normal. Blood sugar elevated consistent with known DM. Blood count normal. Dayna Dunn PA-C  

## 2018-12-25 NOTE — Telephone Encounter (Signed)
Follow up  ° ° °Patient is returning call.  °

## 2019-01-23 ENCOUNTER — Encounter: Payer: Self-pay | Admitting: Pharmacist

## 2019-01-23 ENCOUNTER — Other Ambulatory Visit: Payer: Self-pay

## 2019-01-23 ENCOUNTER — Encounter (INDEPENDENT_AMBULATORY_CARE_PROVIDER_SITE_OTHER): Payer: Self-pay

## 2019-01-23 ENCOUNTER — Ambulatory Visit (INDEPENDENT_AMBULATORY_CARE_PROVIDER_SITE_OTHER): Payer: BC Managed Care – PPO | Admitting: Pharmacist

## 2019-01-23 ENCOUNTER — Telehealth: Payer: Self-pay

## 2019-01-23 DIAGNOSIS — E785 Hyperlipidemia, unspecified: Secondary | ICD-10-CM

## 2019-01-23 DIAGNOSIS — E782 Mixed hyperlipidemia: Secondary | ICD-10-CM

## 2019-01-23 MED ORDER — ICOSAPENT ETHYL 1 G PO CAPS: 2.0000 | ORAL_CAPSULE | Freq: Two times a day (BID) | ORAL | 11 refills | Status: DC

## 2019-01-23 NOTE — Progress Notes (Signed)
Patient ID: Curtis Little                 DOB: November 30, 1950                    MRN: [id number]     HPI: RANBIR CHEW is a 68 y.o. male patient of Dr. Irish Lack referred to lipid clinic by Melina Copa. PMH is significant for CAD (NSTEMI in 2010 s/p BMS to [facility] and atherectomy/stent to RCA, NSTEMI s/p CABG 09/2017), HTN, HLD, DM type 2, probable chronic diastolic CHF.  Patient is referred to lipid clinic for hypertriglyceridemia. Patient blood sugars are well controlled with a last A1C of 6.8.   Patient presents today in good spirits. Having no issues with medications. He says he use to walk 3 miles in the AM and 3 miles at night but then his hip started bothering him. Now the only exercise he gets is walking at work. 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  Current Medications: atorvastatin 80mg  daily Intolerances: none Risk Factors: CAD LDL goal: <70, TG <150  Diet: restricts sugars and carbs  Exercise: no organized exercise  Family History: The patient's family history includes Colon cancer in his mother; Hypertension in his daughter; Other in his sister; Stroke in his father.  Social History: a beer every once in awhile, prior smoker (quite 25 years ago)  Labs: 08/29/2018 TC 153, HDL 34, LDL 48, TG 354  Past Medical History:  Diagnosis Date  . Chronic diastolic CHF (congestive heart failure) ([place])   . Coronary artery disease    a. NSTEMI in 2010 s/p BMS to [facility]LCx and atherectomy/stent to RCA. b. NSTEMI s/p CABG 09/2017 - LIMA to LAD, SVG to DIAGONAL, SVG to OM, SVG to RCA.  . Diabetes mellitus ([place])   . Hyperlipidemia   . Hypertension   . Hypertensive heart disease   . LVH (left ventricular hypertrophy)     Current Outpatient Medications on File Prior to Visit  Medication Sig Dispense Refill  . aspirin EC 81 MG tablet Take 81 mg by mouth daily.    Marland Kitchen atenolol (TENORMIN) 50 MG tablet Take 1 tablet (50 mg total) by mouth daily. 90 tablet 3  . atorvastatin (LIPITOR) 80  MG tablet Take 1 tablet by mouth once daily 90 tablet 0  . furosemide (LASIX) 40 MG tablet Take 1 tablet by mouth once daily 90 tablet 0  . glipiZIDE (GLUCOTROL) 5 MG tablet Take by mouth 2 (two) times daily before a meal.    . hydrochlorothiazide (MICROZIDE) 12.5 MG capsule Take 12.5 mg by mouth daily.    Marland Kitchen losartan (COZAAR) 100 MG tablet Take 100 mg by mouth daily.    . TRESIBA FLEXTOUCH 200 UNIT/ML SOPN Inject 75 Units as directed daily.  6   No current facility-administered medications on file prior to visit.     No Known Allergies  Assessment/Plan:  1. Hyperlipidemia - TG are above goal of <150. Discussed the cardiovascular benefit of Vascepa. Will start Vascepa 2g twice a day. Patient given a copay card. Will repeat labs in 3 months. Patient lives in [place]. Will get labs drawn at the [facility] office. Will call patient in 10 weeks to set up labs.   Thank you,  Ramond Dial, Pharm.D, [place]  [address], [place], [place] [address]  Phone: [phone]; Fax: [phone]

## 2019-01-23 NOTE — Telephone Encounter (Signed)
**Note De-Identified Joretta Eads Obfuscation** I did a Vascepa PA through covermymeds. Key: [id number]  We received a letter from [facility] stating that they have approved the pts Vascepa PA. Approval good from 01/23/2019-01/22/2022.  Per the ;etter they have notified the pts pharmacy of approval.

## 2019-01-23 NOTE — Patient Instructions (Addendum)
It was a pleasure to meet you today.  Try to start walking again. Start with about 15 min per day and work your way up to about 30 min a day 5 days a week  Start taking Vascepa 1g 2 capsules twice a day. Be sure to give the copay card to the pharmacy.  We will repeat another lipid panel in 3 months.  Call us at [phone] with any questions or concerns

## 2019-02-03 ENCOUNTER — Other Ambulatory Visit: Payer: Self-pay | Admitting: Interventional Cardiology

## 2019-02-03 DIAGNOSIS — I10 Essential (primary) hypertension: Secondary | ICD-10-CM

## 2019-02-18 ENCOUNTER — Other Ambulatory Visit: Payer: Self-pay | Admitting: Cardiology

## 2019-03-15 ENCOUNTER — Other Ambulatory Visit: Payer: Self-pay | Admitting: Interventional Cardiology

## 2019-04-13 ENCOUNTER — Telehealth: Payer: Self-pay

## 2019-04-13 NOTE — Telephone Encounter (Signed)
lmom for labs awaiting a callback

## 2019-04-14 ENCOUNTER — Telehealth: Payer: Self-pay

## 2019-04-14 NOTE — Telephone Encounter (Signed)
Called and lmomed the pt instructing them to call back yet again to schedule labs

## 2019-06-24 NOTE — Progress Notes (Deleted)
Cardiology Office Note   Date:  06/24/2019   ID:  Fain, Francis May 23, 1951, MRN [id number]  PCP:  Richardean Chimera, MD    No chief complaint on file.  CAD  Wt Readings from Last 3 Encounters:  12/24/18 207 lb 12.8 oz (94.3 kg)  06/19/18 198 lb 12.8 oz (90.2 kg)  11/13/17 190 lb (86.2 kg)       History of Present Illness: JAIRUS TONNE is a 69 y.o. male   with history of CAD (NSTEMI in 2010 s/p BMS to [facility] and atherectomy/stent to RCA, NSTEMI s/p CABG 09/2017), HTN, HLD, DM type 2, probable chronic diastolic CHF who presents for routine follow-up of his CAD.   Last echo was in 09/2017 around time of CABG showing EF 50-55%, moderate LVH, normal wall motion, and G1DD. He was started on Lasix in 11/2017 and saw Judy Pimple back in f/u 06/2018 and was doing well. ARB was adjusted as he was on duplicate therapy.     Past Medical History:  Diagnosis Date  . Chronic diastolic CHF (congestive heart failure) (HCC)   . Coronary artery disease    a. NSTEMI in 2010 s/p BMS to mLCx and atherectomy/stent to RCA. b. NSTEMI s/p CABG 09/2017 - LIMA to LAD, SVG to DIAGONAL, SVG to OM, SVG to RCA.  . Diabetes mellitus (HCC)   . Hyperlipidemia   . Hypertension   . Hypertensive heart disease   . LVH (left ventricular hypertrophy)     Past Surgical History:  Procedure Laterality Date  . CORONARY ARTERY BYPASS GRAFT N/A 09/05/2017   Procedure: CORONARY ARTERY BYPASS GRAFTING (CABG) x four , using left internal mammary artery and right leg greater saphenous vein harvested endoscopically;  Surgeon: Kerin Perna, MD;  [facility];  Service: Open Heart Surgery;  Laterality: N/A;  . LEFT HEART CATH AND CORONARY ANGIOGRAPHY N/A 09/01/2017   Procedure: LEFT HEART CATH AND CORONARY ANGIOGRAPHY;  Surgeon: Corky Crafts, MD;  Location: [facility] INVASIVE CV LAB;  Service: Cardiovascular;  Laterality: N/A;  . TEE WITHOUT CARDIOVERSION N/A 09/05/2017   Procedure: TRANSESOPHAGEAL ECHOCARDIOGRAM  (TEE);  Surgeon: Donata Clay, Theron Arista, MD;  Location: [facility] OR;  Service: Open Heart Surgery;  Laterality: N/A;     Current Outpatient Medications  Medication Sig Dispense Refill  . aspirin EC 81 MG tablet Take 81 mg by mouth daily.    Marland Kitchen atenolol (TENORMIN) 50 MG tablet Take 1 tablet by mouth once daily 90 tablet 3  . atorvastatin (LIPITOR) 80 MG tablet TAKE 1 TABLET BY MOUTH ONCE DAILY PLEASE  SCHEDULE  APPOINTMENT  FOR  FUTURE  REFILLS 90 tablet 2  . furosemide (LASIX) 40 MG tablet Take 1 tablet by mouth once daily 90 tablet 1  . glipiZIDE (GLUCOTROL) 5 MG tablet Take by mouth 2 (two) times daily before a meal.    . hydrochlorothiazide (MICROZIDE) 12.5 MG capsule Take 12.5 mg by mouth daily.    Bess Harvest Ethyl 1 g CAPS Take 2 capsules (2 g total) by mouth 2 (two) times daily. 120 capsule 11  . losartan (COZAAR) 100 MG tablet Take 100 mg by mouth daily.    . TRESIBA FLEXTOUCH 200 UNIT/ML SOPN Inject 75 Units as directed daily.  6   No current facility-administered medications for this visit.    Allergies:   Patient has no known allergies.    Social History:  The patient  reports that he quit smoking about 20 years ago. He  has never used smokeless tobacco. He reports current alcohol use of about 1.0 standard drinks of alcohol per week. He reports that he does not use drugs.   Family History:  The patient's ***family history includes Colon cancer in his mother; Hypertension in his daughter; Other in his sister; Stroke in his father.    ROS:  Please see the history of present illness.   Otherwise, review of systems are positive for ***.   All other systems are reviewed and negative.    PHYSICAL EXAM: VS:  There were no vitals taken for this visit. , BMI There is no height or weight on file to calculate BMI. GEN: Well nourished, well developed, in no acute distress  HEENT: normal  Neck: no JVD, carotid bruits, or masses Cardiac: ***RRR; no murmurs, rubs, or gallops,no edema  Respiratory:   clear to auscultation bilaterally, normal work of breathing GI: soft, nontender, nondistended, + BS MS: no deformity or atrophy  Skin: warm and dry, no rash Neuro:  Strength and sensation are intact Psych: euthymic mood, full affect   EKG:   The ekg ordered today demonstrates ***   Recent Labs: 12/24/2018: BUN 15; Creatinine, Ser 1.12; Hemoglobin 15.6; Platelets 234; Potassium 4.3; Sodium 139   Lipid Panel    Component Value Date/Time   CHOL 167 09/02/2017 0351   TRIG 142 09/02/2017 0351   HDL 34 (L) 09/02/2017 0351   CHOLHDL 4.9 09/02/2017 0351   VLDL 28 09/02/2017 0351   LDLCALC 105 (H) 09/02/2017 0351     Other studies Reviewed: Additional studies/ records that were reviewed today with results demonstrating: ***.   ASSESSMENT AND PLAN:  1. CAD:  2. Hypertensive heart disease: 3. HTN: 4. Hyperlipidemia:   Current medicines are reviewed at length with the patient today.  The patient concerns regarding his medicines were addressed.  The following changes have been made:  No change***  Labs/ tests ordered today include: *** No orders of the defined types were placed in this encounter.   Recommend 150 minutes/week of aerobic exercise Low fat, low carb, high fiber diet recommended  Disposition:   FU in ***   Signed, Larae Grooms, MD  06/24/2019 10:57 AM    [facility] [address], [place], [place]  [address] Phone: [phone]; Fax: [phone]

## 2019-06-26 ENCOUNTER — Ambulatory Visit: Payer: BC Managed Care – PPO | Admitting: Interventional Cardiology

## 2019-09-07 NOTE — Progress Notes (Signed)
Virtual Visit via Telephone Note   This visit type was conducted due to national recommendations for restrictions regarding the COVID-19 Pandemic (e.g. social distancing) in an effort to limit this patient's exposure and mitigate transmission in our community.  Due to his co-morbid illnesses, this patient is at least at moderate risk for complications without adequate follow up.  This format is felt to be most appropriate for this patient at this time.  The patient did not have access to video technology/had technical difficulties with video requiring transitioning to audio format only (telephone).  All issues noted in this document were discussed and addressed.  No physical exam could be performed with this format.  Please refer to the patient's chart for his  consent to telehealth for [facility].   The patient was identified using 2 identifiers.  Date:  09/08/2019   ID:  Curtis Little [id number], MRN [id number]  Patient Location: Home Provider Location: Home  PCP:  Caryl Bis, MD  Cardiologist:  Larae Grooms, MD  Electrophysiologist:  None   Evaluation Performed:  Follow-Up Visit  Chief Complaint:   Follow up.  History of Present Illness:    Curtis Little is a 69 y.o. male with history of CAD (NSTEMI in 2010 s/p BMS to [facility] and atherectomy/stent to RCA, NSTEMI s/p CABG 09/2017), HTN, HLD, DM type 2, probable chronic diastolic CHF who presents for routine follow-up of his CAD.    Last echo was in 09/2017 around time of CABG showing EF 50-55%, moderate LVH, normal wall motion, and G1DD.  Last seen by Melina Copa PA-C 12/2018 and was doing well. Referred to lipid clinic for Vascepa.  Patient doing well. Denies chest pain, dyspnea, dizziness or presyncope, edema. Walking 1 1/2-3 miles daily. He ran out of vascepa a couple days ago. Received the covid19 vaccine.    The patient does not have symptoms concerning for COVID-19 infection (fever, chills, cough, or new  shortness of breath).    Past Medical History:  Diagnosis Date  . Chronic diastolic CHF (congestive heart failure) ([place])   . Coronary artery disease    a. NSTEMI in 2010 s/p BMS to mLCx and atherectomy/stent to RCA. b. NSTEMI s/p CABG 09/2017 - LIMA to LAD, SVG to DIAGONAL, SVG to OM, SVG to RCA.  . Diabetes mellitus ([place])   . Hyperlipidemia   . Hypertension   . Hypertensive heart disease   . LVH (left ventricular hypertrophy)    Past Surgical History:  Procedure Laterality Date  . CORONARY ARTERY BYPASS GRAFT N/A 09/05/2017   Procedure: CORONARY ARTERY BYPASS GRAFTING (CABG) x four , using left internal mammary artery and right leg greater saphenous vein harvested endoscopically;  Surgeon: Ivin Poot, MD;  Location: [place];  Service: Open Heart Surgery;  Laterality: N/A;  . LEFT HEART CATH AND CORONARY ANGIOGRAPHY N/A 09/01/2017   Procedure: LEFT HEART CATH AND CORONARY ANGIOGRAPHY;  Surgeon: Jettie Booze, MD;  Location: [place] CV LAB;  Service: Cardiovascular;  Laterality: N/A;  . TEE WITHOUT CARDIOVERSION N/A 09/05/2017   Procedure: TRANSESOPHAGEAL ECHOCARDIOGRAM (TEE);  Surgeon: Prescott Gum, Collier Salina, MD;  Location: [place];  Service: Open Heart Surgery;  Laterality: N/A;     Current Meds  Medication Sig  . aspirin EC 81 MG tablet Take 81 mg by mouth daily.  Marland Kitchen atenolol (TENORMIN) 50 MG tablet Take 1 tablet by mouth once daily  . atorvastatin (LIPITOR) 80 MG tablet TAKE 1 TABLET BY MOUTH ONCE DAILY  PLEASE  SCHEDULE  APPOINTMENT  FOR  FUTURE  REFILLS  . furosemide (LASIX) 40 MG tablet Take 1 tablet by mouth once daily  . glipiZIDE (GLUCOTROL) 5 MG tablet Take by mouth 2 (two) times daily before a meal.  . hydrochlorothiazide (MICROZIDE) 12.5 MG capsule Take 12.5 mg by mouth daily.  Bess Harvest Ethyl 1 g CAPS Take 2 capsules (2 g total) by mouth 2 (two) times daily.  Marland Kitchen losartan (COZAAR) 100 MG tablet Take 100 mg by mouth daily.  . traZODone (DESYREL) 50 MG tablet Take 1 tablet  by mouth at bedtime.  . TRESIBA FLEXTOUCH 200 UNIT/ML SOPN Inject 75 Units as directed daily.     Allergies:   Patient has no known allergies.   Social History   Tobacco Use  . Smoking status: Former Smoker    Quit date: 09/02/1998    Years since quitting: 21.0  . Smokeless tobacco: Never Used  Substance Use Topics  . Alcohol use: Yes    Alcohol/week: 1.0 standard drinks    Types: 1 Cans of beer per week  . Drug use: Never     Family Hx: The patient's family history includes Colon cancer in his mother; Hypertension in his daughter; Other in his sister; Stroke in his father.  ROS:   Please see the history of present illness.      All other systems reviewed and are negative.   Prior CV studies:   The following studies were reviewed today: Echo TEE 09/05/17   Aortic valve: The valve is trileaflet. No stenosis. No regurgitation.   Mitral valve: Mild regurgitation, central jet.   Right ventricle: Normal cavity size, wall thickness and ejection  fraction.   Tricuspid valve: Trace regurgitation, regurgitation jet is central.   Left ventricle: Normal LV size, LVEF 50-55%, no significant wall motion  abnormalities   Left Atrial Appendage: normal velocities, no thrombus present    Post Bypass:   Tricuspid, Pulmonic, Mitral and Aortic valve unchanged. Inferoseptal and  anterolateral segments of LV mildly hypokinetic but improving. LV CO 5.4,  4.9 with minimal pressor support. No aortic dissection present after  aortic cannula removed.   Kaylyn Layer Hart [place], MD, [facility]  Anesthesiology         Labs/Other Tests and Data Reviewed:    EKG:  No ECG reviewed.  Recent Labs: 12/24/2018: BUN 15; Creatinine, Ser 1.12; Hemoglobin 15.6; Platelets 234; Potassium 4.3; Sodium 139   Recent Lipid Panel Lab Results  Component Value Date/Time   CHOL 167 09/02/2017 03:51 AM   TRIG 142 09/02/2017 03:51 AM   HDL 34 (L) 09/02/2017 03:51 AM   CHOLHDL 4.9 09/02/2017 03:51 AM   LDLCALC 105  (H) 09/02/2017 03:51 AM    Wt Readings from Last 3 Encounters:  12/24/18 207 lb 12.8 oz (94.3 kg)  06/19/18 198 lb 12.8 oz (90.2 kg)  11/13/17 190 lb (86.2 kg)     Objective:    Vital Signs:  Ht 5\' 7"  (1.702 m)   BMI 32.55 kg/m   BP 120/60-70's  Weight 180 lbs  VITAL SIGNS:  reviewed  ASSESSMENT & PLAN:    CAD (NSTEMI in 2010 s/p BMS to mLCx and atherectomy/stent to RCA, NSTEMI s/p CABG 09/2017)-no anginaon ASA, BB, lipitor  Essential HTN well controlled  HLD-checked by PCP on lipitor and vascepa but ran out of vascepa. Will refill   DM2 managed by PCP  Chronic diastolic CHF-compensated   COVID-19 Education: The signs and symptoms of COVID-19 were discussed with  the patient and how to seek care for testing (follow up with PCP or arrange E-visit).   The importance of social distancing was discussed today.  Time:   Today, I have spent 9:40 minutes with the patient with telehealth technology discussing the above problems.     Medication Adjustments/Labs and Tests Ordered: Current medicines are reviewed at length with the patient today.  Concerns regarding medicines are outlined above.   Tests Ordered: No orders of the defined types were placed in this encounter.   Medication Changes: No orders of the defined types were placed in this encounter.   Follow Up:  Either In Person or Virtual in 1 year(s) Dr. Clifton James  Signed, Jacolyn Reedy, PA-C  09/08/2019 9:21 AM    [facility]

## 2019-09-08 ENCOUNTER — Other Ambulatory Visit: Payer: Self-pay

## 2019-09-08 ENCOUNTER — Telehealth: Payer: Self-pay

## 2019-09-08 ENCOUNTER — Encounter: Payer: Self-pay | Admitting: Physician Assistant

## 2019-09-08 ENCOUNTER — Telehealth (INDEPENDENT_AMBULATORY_CARE_PROVIDER_SITE_OTHER): Payer: BC Managed Care – PPO | Admitting: Physician Assistant

## 2019-09-08 VITALS — Ht 67.0 in

## 2019-09-08 DIAGNOSIS — I25810 Atherosclerosis of coronary artery bypass graft(s) without angina pectoris: Secondary | ICD-10-CM | POA: Diagnosis not present

## 2019-09-08 DIAGNOSIS — E785 Hyperlipidemia, unspecified: Secondary | ICD-10-CM

## 2019-09-08 DIAGNOSIS — I10 Essential (primary) hypertension: Secondary | ICD-10-CM

## 2019-09-08 DIAGNOSIS — E1159 Type 2 diabetes mellitus with other circulatory complications: Secondary | ICD-10-CM | POA: Diagnosis not present

## 2019-09-08 DIAGNOSIS — I5032 Chronic diastolic (congestive) heart failure: Secondary | ICD-10-CM

## 2019-09-08 MED ORDER — ICOSAPENT ETHYL 1 G PO CAPS: 2.0000 g | ORAL_CAPSULE | Freq: Two times a day (BID) | ORAL | 3 refills | Status: DC

## 2019-09-08 NOTE — Patient Instructions (Addendum)
Medication Instructions:  Your physician recommends that you continue on your current medications as directed. Please refer to the Current Medication list given to you today.  *If you need a refill on your cardiac medications before your next appointment, please call your pharmacy*   Lab Work: None If you have labs (blood work) drawn today and your tests are completely normal, you will receive your results only by: . MyChart Message (if you have MyChart) OR . A paper copy in the mail If you have any lab test that is abnormal or we need to change your treatment, we will call you to review the results.   Testing/Procedures: None   Follow-Up: At [facility], you and your health needs are our priority.  As part of our continuing mission to provide you with exceptional heart care, we have created designated Provider Care Teams.  These Care Teams include your primary Cardiologist (physician) and Advanced Practice Providers (APPs -  Physician Assistants and Nurse Practitioners) who all work together to provide you with the care you need, when you need it.  We recommend signing up for the patient portal called "MyChart".  Sign up information is provided on this After Visit Summary.  MyChart is used to connect with patients for Virtual Visits (Telemedicine).  Patients are able to view lab/test results, encounter notes, upcoming appointments, etc.  Non-urgent messages can be sent to your provider as well.   To learn more about what you can do with MyChart, go to [email or web address].    Your next appointment:   12 month(s)  The format for your next appointment:   In Person  Provider:   You may see Jayadeep Varanasi, MD or one of the following Advanced Practice Providers on your designated Care Team:    Dayna Dunn, PA-C  Michele Lenze, PA-C     

## 2019-09-08 NOTE — Telephone Encounter (Signed)
YOUR CARDIOLOGY TEAM HAS ARRANGED FOR AN E-VISIT FOR YOUR APPOINTMENT - PLEASE REVIEW IMPORTANT INFORMATION BELOW SEVERAL DAYS PRIOR TO YOUR APPOINTMENT  Due to the recent COVID-19 pandemic, we are transitioning in-person office visits to tele-medicine visits in an effort to decrease unnecessary exposure to our patients, their families, and staff. These visits are billed to your insurance just like a normal visit is. We also encourage you to sign up for MyChart if you have not already done so. You will need a smartphone if possible. For patients that do not have this, we can still complete the visit using a regular telephone but do prefer a smartphone to enable video when possible. You may have a family member that lives with you that can help. If possible, we also ask that you have a blood pressure cuff and scale at home to measure your blood pressure, heart rate and weight prior to your scheduled appointment. Patients with clinical needs that need an in-person evaluation and testing will still be able to come to the office if absolutely necessary. If you have any questions, feel free to call our office.     YOUR PROVIDER WILL BE USING THE FOLLOWING PLATFORM TO COMPLETE YOUR VISIT: Phone call  . IF USING MYCHART - How to Download the MyChart App to Your SmartPhone   - If Apple, go to App Store and type in MyChart in the search bar and download the app. If Android, ask patient to go to Google Play Store and type in MyChart in the search bar and download the app. The app is free but as with any other app downloads, your phone may require you to verify saved payment information or Apple/Android password.  - You will need to then log into the app with your MyChart username and password, and select [facility] as your healthcare provider to link the account.  - When it is time for your visit, go to the MyChart app, find appointments, and click Begin Video Visit. Be sure to Select Allow for your device to  access the Microphone and Camera for your visit. You will then be connected, and your provider will be with you shortly.  **If you have any issues connecting or need assistance, please contact MyChart service desk [phone] ([phone])**  **If using a computer, in order to ensure the best quality for your visit, you will need to use either of the following Internet Browsers: Google Chrome or Microsoft Edge**  . IF USING DOXIMITY or DOXY.ME - The staff will give you instructions on receiving your link to join the meeting the day of your visit.      2-3 DAYS BEFORE YOUR APPOINTMENT  You will receive a telephone call from one of our [facility] team members - your caller ID may say "Unknown caller." If this is a video visit, we will walk you through how to get the video launched on your phone. We will remind you check your blood pressure, heart rate and weight prior to your scheduled appointment. If you have an Apple Watch or Kardia, please upload any pertinent ECG strips the day before or morning of your appointment to MyChart. Our staff will also make sure you have reviewed the consent and agree to move forward with your scheduled tele-health visit.     THE DAY OF YOUR APPOINTMENT  Approximately 15 minutes prior to your scheduled appointment, you will receive a telephone call from one of HeartCare team - your caller ID may say "Unknown caller."    Our staff will confirm medications, vital signs for the day and any symptoms you may be experiencing. Please have this information available prior to the time of visit start. It may also be helpful for you to have a pad of paper and pen handy for any instructions given during your visit. They will also walk you through joining the smartphone meeting if this is a video visit.    CONSENT FOR TELE-HEALTH VISIT - PLEASE REVIEW  I hereby voluntarily request, consent and authorize [facility] and its employed or contracted physicians, physician  assistants, nurse practitioners or other licensed health care professionals (the Practitioner), to provide me with telemedicine health care services (the "Services") as deemed necessary by the treating Practitioner. I acknowledge and consent to receive the Services by the Practitioner via telemedicine. I understand that the telemedicine visit will involve communicating with the Practitioner through live audiovisual communication technology and the disclosure of certain medical information by electronic transmission. I acknowledge that I have been given the opportunity to request an in-person assessment or other available alternative prior to the telemedicine visit and am voluntarily participating in the telemedicine visit.  I understand that I have the right to withhold or withdraw my consent to the use of telemedicine in the course of my care at any time, without affecting my right to future care or treatment, and that the Practitioner or I may terminate the telemedicine visit at any time. I understand that I have the right to inspect all information obtained and/or recorded in the course of the telemedicine visit and may receive copies of available information for a reasonable fee.  I understand that some of the potential risks of receiving the Services via telemedicine include:  . Delay or interruption in medical evaluation due to technological equipment failure or disruption; . Information transmitted may not be sufficient (e.g. poor resolution of images) to allow for appropriate medical decision making by the Practitioner; and/or  . In rare instances, security protocols could fail, causing a breach of personal health information.  Furthermore, I acknowledge that it is my responsibility to provide information about my medical history, conditions and care that is complete and accurate to the best of my ability. I acknowledge that Practitioner's advice, recommendations, and/or decision may be based on  factors not within their control, such as incomplete or inaccurate data provided by me or distortions of diagnostic images or specimens that may result from electronic transmissions. I understand that the practice of medicine is not an exact science and that Practitioner makes no warranties or guarantees regarding treatment outcomes. I acknowledge that I will receive a copy of this consent concurrently upon execution via email to the email address I last provided but may also request a printed copy by calling the office of [facility].    I understand that my insurance will be billed for this visit.   I have read or had this consent read to me. . I understand the contents of this consent, which adequately explains the benefits and risks of the Services being provided via telemedicine.  . I have been provided ample opportunity to ask questions regarding this consent and the Services and have had my questions answered to my satisfaction. . I give my informed consent for the services to be provided through the use of telemedicine in my medical care  By participating in this telemedicine visit I agree to the above.  

## 2019-09-27 ENCOUNTER — Other Ambulatory Visit: Payer: Self-pay | Admitting: Interventional Cardiology

## 2019-10-21 ENCOUNTER — Telehealth: Payer: Self-pay | Admitting: Pharmacist

## 2019-10-21 DIAGNOSIS — E785 Hyperlipidemia, unspecified: Secondary | ICD-10-CM

## 2019-10-21 NOTE — Telephone Encounter (Signed)
Patient returned call. Set up for labs on 6/3 for lipid panel.

## 2019-11-05 ENCOUNTER — Other Ambulatory Visit: Payer: Self-pay

## 2019-11-05 ENCOUNTER — Other Ambulatory Visit: Payer: BC Managed Care – PPO | Admitting: *Deleted

## 2019-11-05 DIAGNOSIS — E782 Mixed hyperlipidemia: Secondary | ICD-10-CM

## 2019-11-05 LAB — LIPID PANEL
Chol/HDL Ratio: 4.5 ratio (ref 0.0–5.0)
Cholesterol, Total: 153 mg/dL (ref 100–199)
HDL: 34 mg/dL — ABNORMAL LOW (ref >39)
LDL Chol Calc (NIH): 74 mg/dL (ref 0–99)
Triglycerides: 274 mg/dL — ABNORMAL HIGH (ref 0–149)
VLDL Cholesterol Cal: 45 mg/dL — ABNORMAL HIGH (ref 5–40)

## 2019-11-06 ENCOUNTER — Telehealth: Payer: Self-pay | Admitting: Interventional Cardiology

## 2019-11-06 MED ORDER — FENOFIBRATE 48 MG PO TABS
48.0000 mg | ORAL_TABLET | Freq: Every day | ORAL | 3 refills | Status: DC
Start: 2019-11-06 — End: 2020-12-12

## 2019-11-06 NOTE — Telephone Encounter (Signed)
Spoke with patient. He is taking Vascepa. Watches his sugar intake. Blood sugar has been 80-140 in the AM.  Will start fenofibrate 48mg  daily.  Recheck labs in 3 months

## 2019-11-06 NOTE — Telephone Encounter (Signed)
Curtis Little is calling stating he was previously speaking with someone and the call got cut off. I was unable to find any documentation as to what this was in regards to. Please advise.

## 2019-11-18 ENCOUNTER — Other Ambulatory Visit: Payer: Self-pay

## 2019-11-18 MED ORDER — ATORVASTATIN CALCIUM 80 MG PO TABS: ORAL_TABLET | ORAL | 2 refills | Status: DC

## 2019-11-19 ENCOUNTER — Other Ambulatory Visit: Payer: Self-pay | Admitting: Interventional Cardiology

## 2020-02-01 ENCOUNTER — Telehealth: Payer: Self-pay

## 2020-02-01 DIAGNOSIS — E785 Hyperlipidemia, unspecified: Secondary | ICD-10-CM

## 2020-02-01 NOTE — Telephone Encounter (Signed)
lmomed the pt to come in for labs  

## 2020-02-01 NOTE — Telephone Encounter (Signed)
-----   Message from Olene Floss, RPH-CPP sent at 01/29/2020  7:39 AM EDT -----  ----- Message ----- From: Olene Floss, RPH-CPP Sent: 01/29/2020 To: Olene Floss, RPH-CPP  Set up lipids- started fenofibrate

## 2020-02-11 ENCOUNTER — Other Ambulatory Visit: Payer: Self-pay

## 2020-02-11 ENCOUNTER — Telehealth: Payer: Self-pay

## 2020-02-11 DIAGNOSIS — E785 Hyperlipidemia, unspecified: Secondary | ICD-10-CM

## 2020-02-11 NOTE — Telephone Encounter (Signed)
lmomed for fasting lipid and lft

## 2020-02-16 ENCOUNTER — Other Ambulatory Visit: Payer: Self-pay | Admitting: Interventional Cardiology

## 2020-02-16 DIAGNOSIS — I10 Essential (primary) hypertension: Secondary | ICD-10-CM

## 2020-03-01 IMAGING — DX DG CHEST 1V PORT
1 series · 1 of 1 positions shown · non-contrast
Comparison: September 05, 2017

CLINICAL DATA: Status post coronary artery bypass grafting for
coronary artery disease. Hypertension.

EXAM:
PORTABLE CHEST 1 VIEW

[chest]
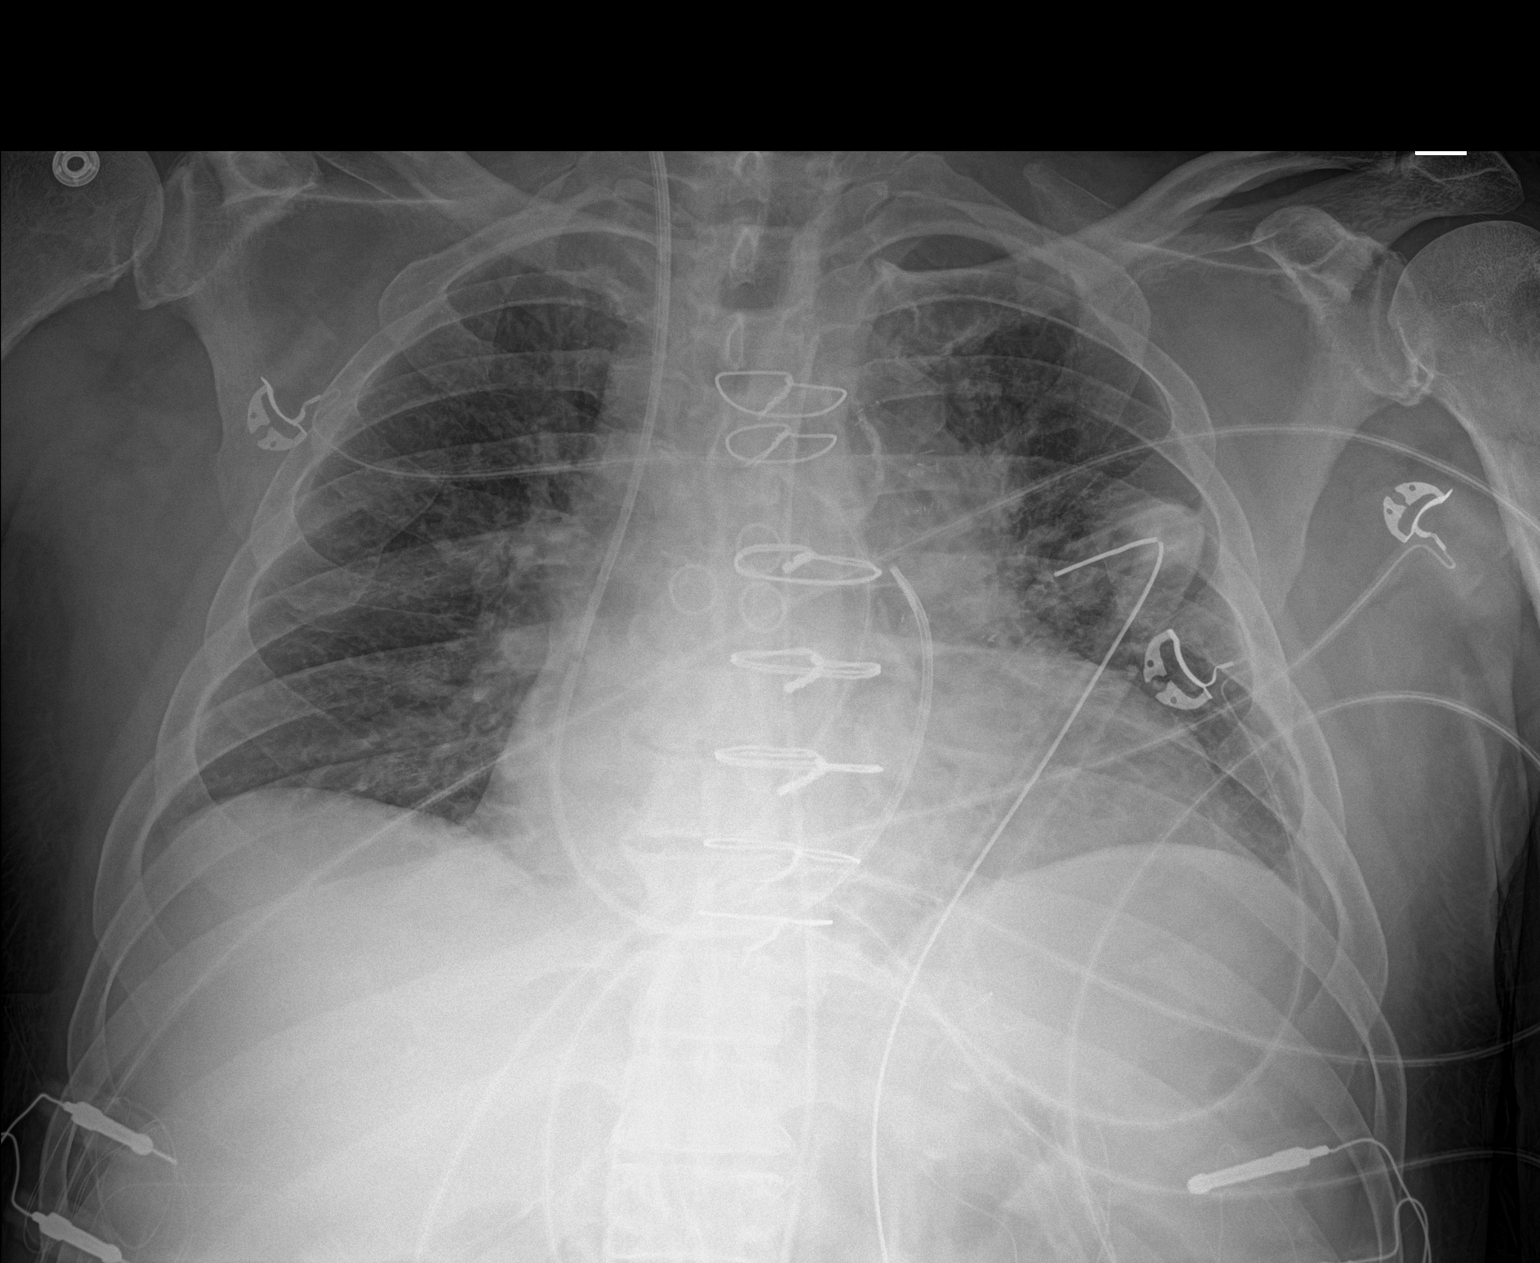

[1 of 1 positions shown; findings below may reference images not displayed]

FINDINGS: Nasogastric tube and endotracheal tube have been removed. Swan-Ganz
catheter tip is in the main pulmonary outflow tract. There is a left
chest tube and a mediastinal drain. Temporary pacemaker wires are
attached to the right heart. No pneumothorax.

There is mild bibasilar atelectasis. No edema or consolidation.
There is stable cardiac prominence. Pulmonary vascularity is normal.
No adenopathy. There is aortic atherosclerosis. No evident bone
lesions.
IMPRESSION: Tube and catheter positions as described without pneumothorax. Mild
bibasilar atelectasis. No consolidation. Stable cardiac prominence.
There is aortic atherosclerosis.

Aortic Atherosclerosis ([id number]).

## 2020-04-15 IMAGING — CR DG CHEST 2V
2 series · 2 of 2 positions shown · non-contrast
Comparison: 09/30/2017 and 09/09/2017

CLINICAL DATA: CABG on 09/05/2017.  Shortness of breath.

EXAM:
CHEST - 2 VIEW

[w chest pa]
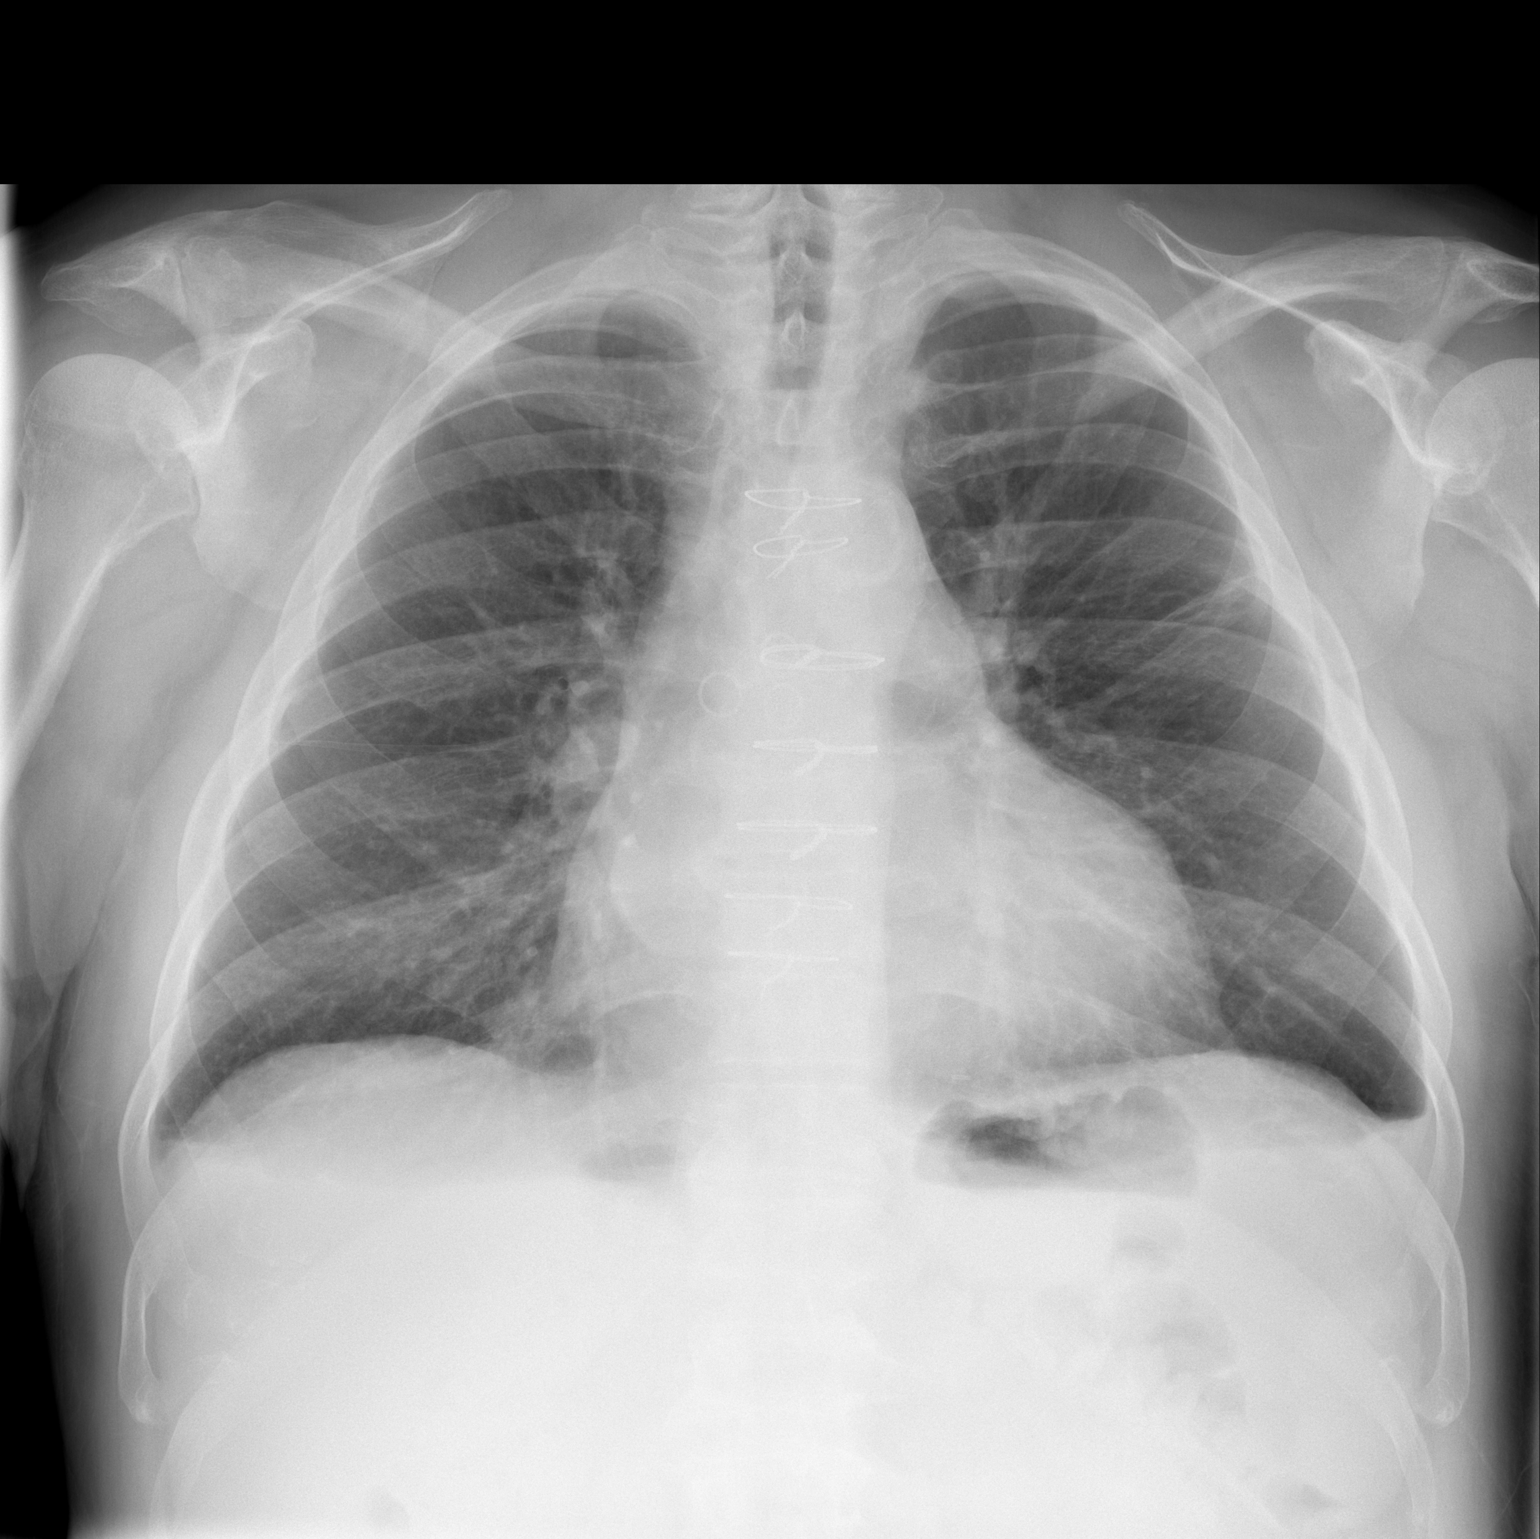

[w chest lat]
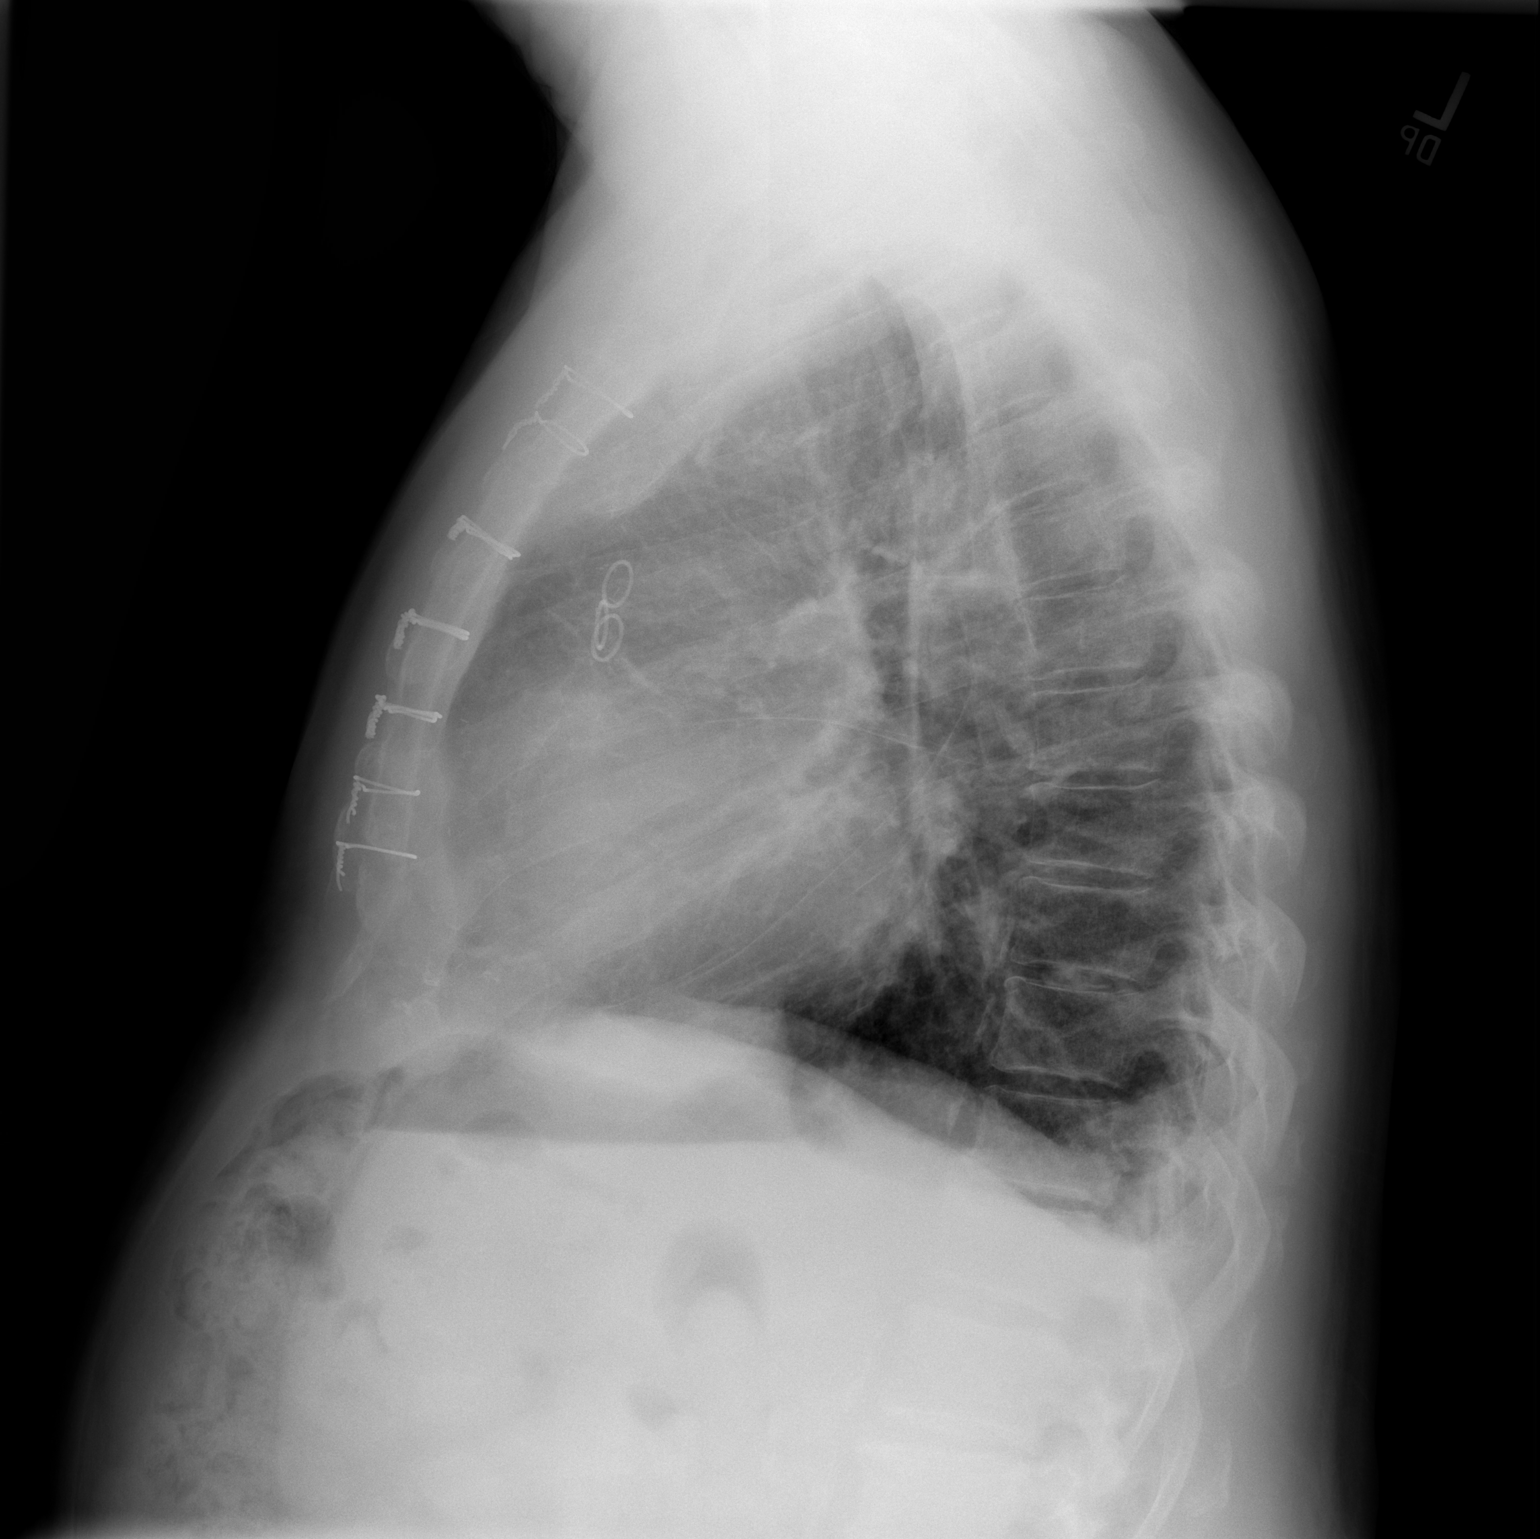

[2 of 2 positions shown; findings below may reference images not displayed]

FINDINGS: The heart size and pulmonary vascularity are normal. CABG. Small
bilateral pleural effusions, recurrent on the right and slightly
increased on the left.

Improving minimal atelectasis in the left midzone.

No pneumothorax.
IMPRESSION: 1. Recurrent small right pleural effusion.
2. Slight increase in small left pleural effusion.
3. Improving minimal atelectasis in the left midzone.

## 2020-08-19 ENCOUNTER — Other Ambulatory Visit: Payer: Self-pay | Admitting: Interventional Cardiology

## 2020-08-19 DIAGNOSIS — I10 Essential (primary) hypertension: Secondary | ICD-10-CM

## 2020-10-21 ENCOUNTER — Other Ambulatory Visit: Payer: Self-pay | Admitting: Interventional Cardiology

## 2020-11-21 ENCOUNTER — Other Ambulatory Visit: Payer: Self-pay | Admitting: Interventional Cardiology

## 2020-11-21 DIAGNOSIS — I10 Essential (primary) hypertension: Secondary | ICD-10-CM

## 2020-11-21 NOTE — Progress Notes (Signed)
Cardiology Office Note   Date:  11/22/2020   ID:  Terri, Malerba 1950-07-19, MRN [id number]  PCP:  Richardean Chimera, MD    No chief complaint on file.  CAD  Wt Readings from Last 3 Encounters:  11/22/20 200 lb 6.4 oz (90.9 kg)  12/24/18 207 lb 12.8 oz (94.3 kg)  06/19/18 198 lb 12.8 oz (90.2 kg)       History of Present Illness: Curtis Little is a 70 y.o. male  with history of CAD (NSTEMI in 2010 s/p BMS to [facility] and atherectomy/stent to RCA, NSTEMI s/p CABG 09/2017), HTN, HLD, DM type 2, probable chronic diastolic CHF who presents for routine follow-up of his CAD.  LIMA to LAD, SVG to DIAGONAL, SVG to OM, SVG to RCA.   Last echo was in 09/2017 around time of CABG showing EF 50-55%, moderate LVH, normal wall motion, and G1DD.   Last seen by Ronie Spies PA-C 12/2018 and was doing well. Referred to lipid clinic for Vascepa.  Walking limited by hip pain.  He will be having an MRI.   Denies : Chest pain. Dizziness. Leg edema. Nitroglycerin use. Orthopnea. Palpitations. Paroxysmal nocturnal dyspnea. Shortness of breath. Syncope.    Still working at [employer].  Works in Development worker, community and driving school bus.      Past Medical History:  Diagnosis Date   Chronic diastolic CHF (congestive heart failure) (HCC)    Coronary artery disease    a. NSTEMI in 2010 s/p BMS to mLCx and atherectomy/stent to RCA. b. NSTEMI s/p CABG 09/2017 - LIMA to LAD, SVG to DIAGONAL, SVG to OM, SVG to RCA.   Diabetes mellitus (HCC)    Hyperlipidemia    Hypertension    Hypertensive heart disease    LVH (left ventricular hypertrophy)     Past Surgical History:  Procedure Laterality Date   CORONARY ARTERY BYPASS GRAFT N/A 09/05/2017   Procedure: CORONARY ARTERY BYPASS GRAFTING (CABG) x four , using left internal mammary artery and right leg greater saphenous vein harvested endoscopically;  Surgeon: Kerin Perna, MD;  Location: [facility] OR;  Service: Open Heart Surgery;  Laterality: N/A;    LEFT HEART CATH AND CORONARY ANGIOGRAPHY N/A 09/01/2017   Procedure: LEFT HEART CATH AND CORONARY ANGIOGRAPHY;  Surgeon: Corky Crafts, MD;  Location: [facility] INVASIVE CV LAB;  Service: Cardiovascular;  Laterality: N/A;   TEE WITHOUT CARDIOVERSION N/A 09/05/2017   Procedure: TRANSESOPHAGEAL ECHOCARDIOGRAM (TEE);  Surgeon: Donata Clay, Theron Arista, MD;  Location: [facility] OR;  Service: Open Heart Surgery;  Laterality: N/A;     Current Outpatient Medications  Medication Sig Dispense Refill   aspirin EC 81 MG tablet Take 81 mg by mouth daily.     atenolol (TENORMIN) 50 MG tablet Take 1 tablet (50 mg total) by mouth daily. Please keep upcoming appointment on 11/22/20. Thank you 90 tablet 0   atorvastatin (LIPITOR) 80 MG tablet TAKE 1 TABLET BY MOUTH ONCE DAILY**APPOINTMENT NEEDED** Please keep upcoming appointment on 11/22/20. Thank you 90 tablet 0   fenofibrate (TRICOR) 48 MG tablet Take 1 tablet (48 mg total) by mouth daily. 90 tablet 3   furosemide (LASIX) 40 MG tablet Take 1 tablet (40 mg total) by mouth daily. Please make overdue appt with Dr. Eldridge Dace before anymore refills. Thank you 1st attempt 30 tablet 0   glipiZIDE (GLUCOTROL) 5 MG tablet Take by mouth 2 (two) times daily before a meal.     hydrochlorothiazide (MICROZIDE) 12.5 MG capsule  Take 12.5 mg by mouth daily.     icosapent Ethyl (VASCEPA) 1 g capsule Take 2 capsules (2 g total) by mouth 2 (two) times daily. 120 capsule 3   losartan (COZAAR) 100 MG tablet Take 100 mg by mouth daily.     traZODone (DESYREL) 50 MG tablet Take 1 tablet by mouth at bedtime.     TRESIBA FLEXTOUCH 200 UNIT/ML SOPN Inject 75 Units as directed daily.  6   No current facility-administered medications for this visit.    Allergies:   Patient has no known allergies.    Social History:  The patient  reports that he quit smoking about 22 years ago. His smoking use included cigarettes. He has never used smokeless tobacco. He reports current alcohol use of about 1.0 standard  drink of alcohol per week. He reports that he does not use drugs.   Family History:  The patient's family history includes Colon cancer in his mother; Hypertension in his daughter; Other in his sister; Stroke in his father.    ROS:  Please see the history of present illness.   Otherwise, review of systems are positive for hip pain.   All other systems are reviewed and negative.    PHYSICAL EXAM: VS:  BP 140/72   Pulse 65   Ht 5\' 7"  (1.702 m)   Wt 200 lb 6.4 oz (90.9 kg)   SpO2 93%   BMI 31.39 kg/m  , BMI Body mass index is 31.39 kg/m. GEN: Well nourished, well developed, in no acute distress HEENT: normal Neck: no JVD, carotid bruits, or masses Cardiac: RRR; no murmurs, rubs, or gallops,no edema  Respiratory:  clear to auscultation bilaterally, normal work of breathing GI: soft, nontender, nondistended, + BS MS: no deformity or atrophy Skin: warm and dry, no rash Neuro:  Strength and sensation are intact Psych: euthymic mood, full affect   EKG:   The ekg ordered today demonstrates NSR, nonspecific ST changes   Recent Labs: No results found for requested labs within last 8760 hours.   Lipid Panel    Component Value Date/Time   CHOL 153 11/05/2019 0916   TRIG 274 (H) 11/05/2019 0916   HDL 34 (L) 11/05/2019 0916   CHOLHDL 4.5 11/05/2019 0916   CHOLHDL 4.9 09/02/2017 0351   VLDL 28 09/02/2017 0351   LDLCALC 74 11/05/2019 0916     Other studies Reviewed: Additional studies/ records that were reviewed today with results demonstrating: .   ASSESSMENT AND PLAN:  CAD: No angina.  Continue aggressive secondary prevention.  Explained CABG from 2019.  HTN: Can stop HCTZ.  Continue Furosemide.  The current medical regimen is effective;  continue present plan and medications. DM2: A1C 7.4.  Whole food, plant-based diet recommended.  Recheck A1C today.  Hyperlipidemia: Refer to lipid clinic given LDL 99.  LDL above target despite maximal statin dose.  May need to consider  PCSK9 inhibitor if it is affordable.  Recheck lipids today. Chronic diastolic heart failure:  Appears euvolemic.  Leg swelling when he misses diuretic.  Furosemide is likely more effective diuretic for his volume overload than the HCTZ.   Current medicines are reviewed at length with the patient today.  The patient concerns regarding his medicines were addressed.  The following changes have been made:  stop HCTZ  Labs/ tests ordered today include:  No orders of the defined types were placed in this encounter.   Recommend 150 minutes/week of aerobic exercise Low fat, low carb, high fiber diet recommended  Disposition:   FU in so we will see what his labs are he is aware but some he does not EAA he does not need both   Signed, Lance Muss, MD  11/22/2020 9:37 AM    [facility] [address] [place], [place], [place]  [address] Phone: [phone]; Fax: [phone]

## 2020-11-22 ENCOUNTER — Ambulatory Visit: Payer: BC Managed Care – PPO | Admitting: Interventional Cardiology

## 2020-11-22 ENCOUNTER — Other Ambulatory Visit: Payer: Self-pay

## 2020-11-22 ENCOUNTER — Encounter: Payer: Self-pay | Admitting: Interventional Cardiology

## 2020-11-22 VITALS — BP 140/72 | HR 65 | Ht 67.0 in | Wt 200.4 lb

## 2020-11-22 DIAGNOSIS — E782 Mixed hyperlipidemia: Secondary | ICD-10-CM

## 2020-11-22 DIAGNOSIS — E1159 Type 2 diabetes mellitus with other circulatory complications: Secondary | ICD-10-CM | POA: Diagnosis not present

## 2020-11-22 DIAGNOSIS — I10 Essential (primary) hypertension: Secondary | ICD-10-CM

## 2020-11-22 DIAGNOSIS — I25810 Atherosclerosis of coronary artery bypass graft(s) without angina pectoris: Secondary | ICD-10-CM

## 2020-11-22 DIAGNOSIS — I5032 Chronic diastolic (congestive) heart failure: Secondary | ICD-10-CM | POA: Diagnosis not present

## 2020-11-22 NOTE — Patient Instructions (Addendum)
Medication Instructions:  Your physician has recommended you make the following change in your medication: Stop hydrochlorothiazide   Lab Work: Lab work to be done today--CMET, lipids,A1C If you have labs (blood work) drawn today and your tests are completely normal, you will receive your results only by: MyChart Message (if you have MyChart) OR A paper copy in the mail If you have any lab test that is abnormal or we need to change your treatment, we will call you to review the results.   Testing/Procedures: none   Follow-Up: At [facility], you and your health needs are our priority.  As part of our continuing mission to provide you with exceptional heart care, we have created designated Provider Care Teams.  These Care Teams include your primary Cardiologist (physician) and Advanced Practice Providers (APPs -  Physician Assistants and Nurse Practitioners) who all work together to provide you with the care you need, when you need it.  We recommend signing up for the patient portal called "MyChart".  Sign up information is provided on this After Visit Summary.  MyChart is used to connect with patients for Virtual Visits (Telemedicine).  Patients are able to view lab/test results, encounter notes, upcoming appointments, etc.  Non-urgent messages can be sent to your provider as well.   To learn more about what you can do with MyChart, go to [email or web address].    Your next appointment:   12 month(s)  The format for your next appointment:   In Person  Provider:   You may see Lance Muss, MD or one of the following Advanced Practice Providers on your designated Care Team:   Ronie Spies, PA-C Jacolyn Reedy, PA-C   Other Instructions You have been referred to the lipid clinic in our office --Appointment is July 8 at 9:30

## 2020-11-23 LAB — COMPREHENSIVE METABOLIC PANEL
ALT: 30 IU/L (ref 0–44)
AST: 16 IU/L (ref 0–40)
Albumin/Globulin Ratio: 1.8 (ref 1.2–2.2)
Albumin: 4.6 g/dL (ref 3.8–4.8)
Alkaline Phosphatase: 58 IU/L (ref 44–121)
BUN/Creatinine Ratio: 23 (ref 10–24)
BUN: 28 mg/dL — ABNORMAL HIGH (ref 8–27)
Bilirubin Total: 0.8 mg/dL (ref 0.0–1.2)
CO2: 25 mmol/L (ref 20–29)
Calcium: 9.9 mg/dL (ref 8.6–10.2)
Chloride: 95 mmol/L — ABNORMAL LOW (ref 96–106)
Creatinine, Ser: 1.21 mg/dL (ref 0.76–1.27)
Globulin, Total: 2.6 g/dL (ref 1.5–4.5)
Glucose: 163 mg/dL — ABNORMAL HIGH (ref 65–99)
Potassium: 3.8 mmol/L (ref 3.5–5.2)
Sodium: 136 mmol/L (ref 134–144)
Total Protein: 7.2 g/dL (ref 6.0–8.5)
eGFR: 65 mL/min/{1.73_m2} (ref >59)

## 2020-11-23 LAB — HEMOGLOBIN A1C
Est. average glucose Bld gHb Est-mCnc: 206 mg/dL
Hgb A1c MFr Bld: 8.8 % — ABNORMAL HIGH (ref 4.8–5.6)

## 2020-11-23 LAB — LIPID PANEL
Chol/HDL Ratio: 6.6 ratio — ABNORMAL HIGH (ref 0.0–5.0)
Cholesterol, Total: 184 mg/dL (ref 100–199)
HDL: 28 mg/dL — ABNORMAL LOW (ref >39)
LDL Chol Calc (NIH): 94 mg/dL (ref 0–99)
Triglycerides: 368 mg/dL — ABNORMAL HIGH (ref 0–149)
VLDL Cholesterol Cal: 62 mg/dL — ABNORMAL HIGH (ref 5–40)

## 2020-11-27 ENCOUNTER — Other Ambulatory Visit: Payer: Self-pay | Admitting: Physician Assistant

## 2020-12-01 ENCOUNTER — Telehealth: Payer: Self-pay | Admitting: *Deleted

## 2020-12-01 NOTE — Telephone Encounter (Signed)
-----   Message from Corky Crafts, MD sent at 11/27/2020 12:00 AM EDT ----- Refer to [facility] lipid clinic given TG and LDL well above target.

## 2020-12-01 NOTE — Telephone Encounter (Signed)
I spoke with patient and he is aware of lipid clinic appointment on July 8

## 2020-12-09 ENCOUNTER — Ambulatory Visit (INDEPENDENT_AMBULATORY_CARE_PROVIDER_SITE_OTHER): Payer: BC Managed Care – PPO | Admitting: Pharmacist

## 2020-12-09 ENCOUNTER — Telehealth: Payer: Self-pay | Admitting: Pharmacist

## 2020-12-09 ENCOUNTER — Other Ambulatory Visit: Payer: Self-pay

## 2020-12-09 DIAGNOSIS — E118 Type 2 diabetes mellitus with unspecified complications: Secondary | ICD-10-CM

## 2020-12-09 DIAGNOSIS — E785 Hyperlipidemia, unspecified: Secondary | ICD-10-CM

## 2020-12-09 DIAGNOSIS — Z794 Long term (current) use of insulin: Secondary | ICD-10-CM

## 2020-12-09 MED ORDER — EZETIMIBE 10 MG PO TABS: 10.0000 mg | ORAL_TABLET | Freq: Every day | ORAL | 3 refills | Status: DC

## 2020-12-09 MED ORDER — OZEMPIC (0.25 OR 0.5 MG/DOSE) 2 MG/1.5ML ~~LOC~~ SOPN: 0.2500 mg | PEN_INJECTOR | SUBCUTANEOUS | 1 refills | Status: DC

## 2020-12-09 NOTE — Progress Notes (Signed)
Patient ID: ARTH NICASTRO                 DOB: 11/03/1950                    MRN: [id number]     HPI: Curtis Little is a 70 y.o. male patient of Dr. Eldridge Dace referred to lipid clinic by Ronie Spies. PMH is significant for CAD (NSTEMI in 2010 s/p BMS to [facility] and atherectomy/stent to RCA, NSTEMI s/p CABG 09/2017), HTN, HLD, DM type 2, probable chronic diastolic CHF.  Patient was previously seen in the lipid linic in 2020. He was started on Vascepa for elevated TG. Follow up labs showed persistently elevated TG. Fenofibrate was started. Last TG were 368. A1C 8.8 in June.   Patient is referred to lipid clinic for hypertriglyceridemia/elevated LDL. He states he has been compliant with his medications. Medications were reviewed. He does not have apt with his PCP until August to discuss his A1C. He does not exercise and states he eats "what he shouldn't eat." He lives alone and just doesn't know what to cook. Eats fast food a lot for dinner. Currently on glipizde and long acting insulin. Does not always check his blood sugars. When he does, fasting BG is around 170-200 and after meals is around 200.   Current Medications: atorvastatin 80mg  daily, Vascepa 2g BID, fenofibrate 48mg  daily Intolerances: none Risk Factors: CAD LDL goal: <70, TG <150  Diet: breakfast: eggs, bacon- sometimes doesn't eat breakfast No lunch, early dinner: hamburger or hot dog, fried chicken ([employer] or [employer]) fries Drink: diet coke, sweet tea with sweet n low, water, coffee black with sweet n low  Exercise: "not as much as he should" use to walk but it started hurting his hips  Family History: The patient's family history includes Colon cancer in his mother; Hypertension in his daughter; Other in his sister; Stroke in his father.  Social History: a beer every once in awhile, prior smoker (quite 25 years ago)  Labs:  11/22/2020:TC 184, HDL 28, LDL 94, TG 368 (atorvastatin 80mg  daily, Vascepa 2g BID, fenofibrate 48mg   ) 08/29/2018 TC 153, HDL 34, LDL 48, TG 354 (atorvastatin 80mg )  Past Medical History:  Diagnosis Date   Chronic diastolic CHF (congestive heart failure) (HCC)    Coronary artery disease    a. NSTEMI in 2010 s/p BMS to mLCx and atherectomy/stent to RCA. b. NSTEMI s/p CABG 09/2017 - LIMA to LAD, SVG to DIAGONAL, SVG to OM, SVG to RCA.   Diabetes mellitus (HCC)    Hyperlipidemia    Hypertension    Hypertensive heart disease    LVH (left ventricular hypertrophy)     Current Outpatient Medications on File Prior to Visit  Medication Sig Dispense Refill   aspirin EC 81 MG tablet Take 81 mg by mouth daily.     atenolol (TENORMIN) 50 MG tablet Take 1 tablet (50 mg total) by mouth daily. Please keep upcoming appointment on 11/22/20. Thank you 90 tablet 0   atorvastatin (LIPITOR) 80 MG tablet TAKE 1 TABLET BY MOUTH ONCE DAILY**APPOINTMENT NEEDED** Please keep upcoming appointment on 11/22/20. Thank you 90 tablet 0   fenofibrate (TRICOR) 48 MG tablet Take 1 tablet (48 mg total) by mouth daily. 90 tablet 3   furosemide (LASIX) 40 MG tablet Take 1 tablet (40 mg total) by mouth daily. Please make overdue appt with Dr. before anymore refills. Thank you 1st attempt 30 tablet 0  glipiZIDE (GLUCOTROL) 5 MG tablet Take by mouth 2 (two) times daily before a meal.     losartan (COZAAR) 100 MG tablet Take 100 mg by mouth daily.     traZODone (DESYREL) 50 MG tablet Take 1 tablet by mouth at bedtime.     TRESIBA FLEXTOUCH 200 UNIT/ML SOPN Inject 75 Units as directed daily.  6   VASCEPA 1 g capsule Take 2 capsules by mouth twice daily 120 capsule 1   No current facility-administered medications on file prior to visit.    No Known Allergies  Assessment/Plan:  1. Hypertriglyceridemia - TG are above goal of <150. Elevated TG are most likely due to uncontrolled blood sugar. We discussed the CV/DM benefit of Ozempic. Injection technique/side effects/dosing reviewed with patient. He is in agreement to  start ozempic 0.25mg  once weekly. I will call patient in 1 month to follow up and remind him to increase to 0.5mg  weekly. I have given him a copay card for Ozempic and helped him activate it. STOP glipizide. Patient has been on for a while, most likely no longer effective and this medication can cause weight gain and low blood sugar.Continue Vascepa 2g BID and fenofibrate 48mg  daily.  2. Dyslipidemia -LDL is also above goal of . Will add zetia 10mg  daily. Continue atorvastatin 80mg , Vascepa 2g BID and fenofibrate 48mg  daily. Recheck lipid panel in 3 months.  3. DM- See plan above for ozempic. STOP glipizide. CONTINUE Tresiba 70 units daily. We talked about diet- increasing vegetable intake, different ways to prepare vegetables and lean meats and where to find good recipes. Aim for diet high in vegetables, beans, some fruit, lean meats and nuts.   Thank you,  <25, Pharm.D, BCPS [facility]  [address] [place], [place]  Phone: [phone]; Fax: [phone]

## 2020-12-09 NOTE — Telephone Encounter (Signed)
Received VM from pt to call him back about medication I rx today. Called pt. It was having a hard time finding the 0.25 mark on his pen but he found it while on the phone with me  I also confirmed with patient that he does not have a fx or personal hx of thyroid cancer or pancreatitis.

## 2020-12-09 NOTE — Patient Instructions (Addendum)
STOP taking glipizide START taking Ozempic 0.25mg  once a week START taking zetia 10mg  daily  I will call you in 1 month to see how you are doing on Ozempic and remind you to increase to 0.5mg .   Give your copay card to the pharmacy  Call me at [phone] with any questions  Go for a walk before the sun comes up  Reduce your fast food intake- increase your vegetable intake  TIPS for Living a healthier life  SUGAR  Sugar is a huge problem in the modern day diet. Sugar is a HUGE contributor to heart disease, diabetes, high triglyceride levels, fatty liver diease and obesity. Sugar is hidden in almost all packaged foods/beverages. Added sugar is extra sugar that is added beyond what is naturally found. It adds no nutritional benefit to your body and can cause major harm. The American Heart Association recommends limiting added sugars to no more than 25g for women and 36 grams for men per day.  There are many names for sugar maltose, sucrose (names ending in "ose"), high fructose corn syrup, molasses, cane sugar, corn sweetener, raw sugar, syrup, honey or fruit juice concentrate.   One of the best ways to limit your added sugars is to stop drinking sweetened beverages such as soda, sweet tea, fruit juice or fancy coffee's. There is 65g of added sugars in one 20oz bottle of Coke!! That is equal to 6 donuts.   Pay attention and read all nutrition facts labels. Below is an examples of a nutrition facts label. The #1 is showing you the total sugars where the # 2 is showing you the added sugars. This one severing has almost the max amount of added sugars per day!  Watch out for items that say "low fat" or "no added sugar" as these products are typically very high in sugar. The food industry uses these terms to fool you into thinking they are healthy.  For more information on the dangers of sugar watch WHY Sugar is as Bad as Alcohol (Fructose, The Liver Toxin) on  YouTube.    EXERCISE  Exercise is good. We've all heard that. In an ideal world, we would all have time and resources to  get plenty of it. When you are active your heart pumps more efficiently and you will feel better.  Multiple studies show that even walking regularly has benefits that include living a longer life.  The American Heart Association recommends 90-150 minutes per week of exercise (30 minutes  per day most days of the week). You can do this in any increment you wish. Nine or more  10-minute walks count. So does an hour-long exercise class. Break the time apart into what will  work in your life. Some of the best things you can do include walking briskly, jogging, cycling or  swimming laps. Not everyone is ready to "exercise." Sometimes we need to start with just getting active. Here  are some easy ways to be more active throughout the day:  Take the stairs instead of the elevator  Go for a 10-15 minute walk during your lunch break (find a friend to make it more enjoyable)  When shopping, park at the back of the parking lot  If you take public transportation, get off one stop early and walk the extra distance  Pace around while making phone calls (most of [phone] are not attached to phone cords any longer!) Check with your doctor if you aren't sure what your limitations may be. Always  remember to drink plenty of water when doing any type of exercise. Don't feel like a failure if you're not getting the 90-150 minutes per week. If you started by being  a couch potato, then just a 10-minute walk each day is a huge improvement. Start with little  victories and work your way up.   Healthy Eating Tips  When looking to improve your eating habits, whether to lose weight, lower blood pressure or just be healthier, it helps to know what a serving size is.   Grains 1 slice of bread,  bagel,  cup pasta or rice  Vegetables 1 cup fresh or raw vegetables,  cup cooked or  canned Fruits 1 piece of medium sized fruit,  cup canned,   Meats/Proteins  cup dried       1 oz meat, 1 egg,  cup cooked beans, nuts or seeds  Dairy        Fats Individual yogurt container, 1 cup (8oz)    1 teaspoon margarine/butter or vegetable  milk or milk alternative, 1 slice of cheese          oil; 1 tablespoon mayonnaise or salad dressing                  Plan ahead: make a menu of the meals for a week then create a grocery list to go with  that menu. Consider meals that easily stretch into a night of leftovers, such as stews or  casseroles. Or consider making two of your favorite meal and put one in the freezer or fridge for  another night.  When you get home from the grocery store wash and prepare your vegetables and fruits.  Then when you need them they are ready to go.  Tips for going to the grocery store:  Buy store or generic brands  Check the weekly ad from your store on-line or in their in-store flyer  Look at the unit price on the shelf tag to compare/contrast the costs of different items  Buy fruits/vegetables in season  Carrots, bananas and apples are low-cost, naturally healthy items  If meats or frozen vegetables are on sale, buy some extras and put in your freezer  Limit buying prepared or "ready to eat" items, even if they are pre-made salads or fruit snacks  Do not shop when you're hungry  Foods at eye level tend to be more expensive. Look on the high and low shelves for deals.  Consider shopping at the farmer's market for fresh foods in season.  Choose canned tuna or salmon instead of fresh  Avoid the cookie and chip aisles (these are expensive, high in calories and low in  nutritional value). Shop on the outside of the grocery store.  Aim to have one 12 hour fast each day. This means no eating after dinner until breakfast. For example, if you eat dinner around 6 PM then you would not eat anything until 6 AM the next day. This is a great way to help lower  your insulin levels, lose weight and reduce your blood pressure.   Healthy food preparations:  If you can't get lean hamburger, be sure to drain the fat when cooking  Steam, saut (in olive oil), grill or bake foods  Experiment with different seasonings to avoid adding salt to your foods. Kosher salt, sea salt and Himalayan salt are all still salt and should be avoided Try seasoning food with onion, garlic, thyme, rosemary, basil ect. Onion powder or garlic  powder is ok. Avoid if it says salt (ie garlic salt).        Resources: American Heart Association - MartiniMobile.it Go to the Healthy Living tab to get more information American Diabetes Association - www.diabetes.org You don't have to be diabetic - check out the Food and Fitness tab

## 2020-12-11 ENCOUNTER — Other Ambulatory Visit: Payer: Self-pay | Admitting: Interventional Cardiology

## 2021-01-03 ENCOUNTER — Telehealth: Payer: Self-pay | Admitting: Student-PharmD

## 2021-01-03 NOTE — Telephone Encounter (Signed)
Called patient to discuss how he is doing since starting Ozempic. He takes it every Friday (first dose was 7/8) and has taken four 0.25 mg doses thus far. He is due to increase to 0.5 mg weekly dose. Denies any adverse effects or issues administering the injection. He confirmed that he has stopped taking glipizide. Is not checking his blood sugars every day, usually every few days, but reports his sugars seem to be improving. His fasting BG in the mornings are 120s-160s (down from previously reported 170s-200s at last visit), but are still not quite to goal 80-130 mg/dL. He will increase to 0.5 mg dose starting this Friday. Will call him in about 4 weeks to see how his blood sugars are doing and if we need to increase to 1 mg weekly. Next lipid panel will be due 03/2021, could check A1c at that time as well (last on 11/22/20). Can schedule lab appt at next phone f/u.

## 2021-02-03 ENCOUNTER — Telehealth: Payer: Self-pay | Admitting: Pharmacist

## 2021-02-03 NOTE — Telephone Encounter (Signed)
Called patient to see how he is doing on Ozempic 0.5mg  weekly. He is due to increase to 1mg  if he is tolerating well. Called to see how BG is doing Will need lipid and A1c labs scheduled for Oct LVM

## 2021-02-07 MED ORDER — OZEMPIC (1 MG/DOSE) 4 MG/3ML ~~LOC~~ SOPN: 1.0000 mg | PEN_INJECTOR | SUBCUTANEOUS | 11 refills | Status: DC

## 2021-02-07 NOTE — Telephone Encounter (Signed)
Spoke with patient. He states that it took 2 weeks to get a refill on his ozempic. He was out for almost 2 weeks. He did restart 1 week ago at the 0.5mg  dose. He is doing well on the medications. AM blood sugars are 160-200. Does not check after meals. He is interested in increasing to 1mg  dose. I will have him complete another 3 weeks of 0.5mg  before increasing. Will send Rx in now incase he has difficulty getting again. I have asked him to call me if his pharmacy has issues getting in stock.

## 2021-02-07 NOTE — Addendum Note (Signed)
Addended by: Malena Peer D on: 02/07/2021 11:29 AM   Modules accepted: Orders

## 2021-02-15 ENCOUNTER — Other Ambulatory Visit: Payer: Self-pay | Admitting: Interventional Cardiology

## 2021-02-15 DIAGNOSIS — I10 Essential (primary) hypertension: Secondary | ICD-10-CM

## 2021-03-06 ENCOUNTER — Telehealth: Payer: Self-pay

## 2021-03-06 DIAGNOSIS — E785 Hyperlipidemia, unspecified: Secondary | ICD-10-CM

## 2021-03-06 NOTE — Telephone Encounter (Signed)
-----   Message from Olene Floss, RPH-CPP sent at 03/03/2021 12:34 PM EDT -----  ----- Message ----- From: Olene Floss, RPH-CPP Sent: 03/03/2021  12:00 AM EDT To: Olene Floss, RPH-CPP  Set up lipids

## 2021-03-06 NOTE — Telephone Encounter (Signed)
Lmom pt to call to schedule lab appt

## 2021-03-08 NOTE — Telephone Encounter (Signed)
Left patient message to see if he was able to get the 1mg  dose of Ozempic. Also need to set up lipid panel and A1C. LVM for pt to call back

## 2021-11-17 ENCOUNTER — Telehealth: Payer: Self-pay | Admitting: Pharmacist

## 2021-11-17 NOTE — Telephone Encounter (Signed)
Reviewed labs that were sent over from PCP- from 08/29/21  LDL-C 81 (down from 94) TG 181 (down from 368) A1C 6.7 (down from 8.5)  Patient on atorvastatin 80mg  daily, zetia 10mg  daily, fenofibrate 48mg  and Vascepa 2g BID.  Started on Ozempic about a year ago  Called pt. Discussed options for his LDL-C. He has the state health plan that will prefer [place] come July 1. He does not have medicare so he should be able to use co-pay card and get for $5. I think we would be able to stop the zetia as well. Patient would like to discuss with Dr. at apt next month.

## 2021-11-29 ENCOUNTER — Other Ambulatory Visit: Payer: Self-pay | Admitting: Interventional Cardiology

## 2021-11-29 DIAGNOSIS — I10 Essential (primary) hypertension: Secondary | ICD-10-CM

## 2021-12-18 NOTE — Progress Notes (Signed)
Cardiology Office Note   Date:  12/20/2021   ID:  Curtis, Little 01/18/1951, MRN [id number]  PCP:  [facility]    No chief complaint on file.  CAD  Wt Readings from Last 3 Encounters:  12/20/21 187 lb (84.8 kg)  11/22/20 200 lb 6.4 oz (90.9 kg)  12/24/18 207 lb 12.8 oz (94.3 kg)       History of Present Illness: Curtis Little is a 71 y.o. male  with history of CAD (NSTEMI in 2010 s/p BMS to [facility] and atherectomy/stent to RCA, NSTEMI s/p CABG 09/2017), HTN, HLD, DM type 2, probable chronic diastolic CHF who presents for routine follow-up of his CAD.  LIMA to LAD, SVG to DIAGONAL, SVG to OM, SVG to RCA.   Last echo was in 09/2017 around time of CABG showing EF 50-55%, moderate LVH, normal wall motion, and G1DD.   Last seen by Curtis Spies PA-C 12/2018 and was doing well. Referred to lipid clinic for Vascepa.   Walking limited by hip pain.  He will be having an MRI.   Still working at [employer].  Works in Development worker, community and driving school bus.    Denies : Chest pain. Dizziness. Leg edema. Nitroglycerin use. Orthopnea. Palpitations. Paroxysmal nocturnal dyspnea. Shortness of breath. Syncope.    Occasional GERD, noticed since his CABG.  Not associated with exertion.    Past Medical History:  Diagnosis Date   Chronic diastolic CHF (congestive heart failure) (HCC)    Coronary artery disease    a. NSTEMI in 2010 s/p BMS to mLCx and atherectomy/stent to RCA. b. NSTEMI s/p CABG 09/2017 - LIMA to LAD, SVG to DIAGONAL, SVG to OM, SVG to RCA.   Diabetes mellitus (HCC)    Hyperlipidemia    Hypertension    Hypertensive heart disease    LVH (left ventricular hypertrophy)     Past Surgical History:  Procedure Laterality Date   CORONARY ARTERY BYPASS GRAFT N/A 09/05/2017   Procedure: CORONARY ARTERY BYPASS GRAFTING (CABG) x four , using left internal mammary artery and right leg greater saphenous vein harvested endoscopically;  Surgeon: Curtis Perna,  MD;  Location: [facility] OR;  Service: Open Heart Surgery;  Laterality: N/A;   LEFT HEART CATH AND CORONARY ANGIOGRAPHY N/A 09/01/2017   Procedure: LEFT HEART CATH AND CORONARY ANGIOGRAPHY;  Surgeon: Curtis Crafts, MD;  Location: [facility] INVASIVE CV LAB;  Service: Cardiovascular;  Laterality: N/A;   TEE WITHOUT CARDIOVERSION N/A 09/05/2017   Procedure: TRANSESOPHAGEAL ECHOCARDIOGRAM (TEE);  Surgeon: Curtis Little, Curtis Arista, MD;  Location: [facility] OR;  Service: Open Heart Surgery;  Laterality: N/A;     Current Outpatient Medications  Medication Sig Dispense Refill   aspirin EC 81 MG tablet Take 81 mg by mouth daily.     atenolol (TENORMIN) 50 MG tablet Take 1 tablet by mouth once daily 90 tablet 0   atorvastatin (LIPITOR) 80 MG tablet Take 1 tablet by mouth once daily 90 tablet 0   ezetimibe (ZETIA) 10 MG tablet Take 1 tablet (10 mg total) by mouth daily. 90 tablet 3   fenofibrate (TRICOR) 48 MG tablet Take 1 tablet by mouth once daily 90 tablet 3   furosemide (LASIX) 40 MG tablet Take 1 tablet (40 mg total) by mouth daily. Please make overdue appt with Dr. Eldridge Dace before anymore refills. Thank you 1st attempt 30 tablet 0   losartan (COZAAR) 100 MG tablet Take 100 mg by mouth daily.     metFORMIN (  GLUCOPHAGE-XR) 500 MG 24 hr tablet Take 1,000 mg by mouth daily.     Semaglutide, 1 MG/DOSE, (OZEMPIC, 1 MG/DOSE,) 4 MG/3ML SOPN Inject 1 mg into the skin once a week. 3 mL 11   traZODone (DESYREL) 50 MG tablet Take 1 tablet by mouth at bedtime.     TRESIBA FLEXTOUCH 200 UNIT/ML SOPN Inject 70 Units as directed daily.  6   VASCEPA 1 g capsule Take 2 capsules by mouth twice daily 120 capsule 1   No current facility-administered medications for this visit.    Allergies:   Patient has no known allergies.    Social History:  The patient  reports that he quit smoking about 23 years ago. His smoking use included cigarettes. He has never used smokeless tobacco. He reports current alcohol use of about 1.0 standard drink  of alcohol per week. He reports that he does not use drugs.   Family History:  The patient's family history includes Colon cancer in his mother; Hypertension in his daughter; Other in his sister; Stroke in his father.    ROS:  Please see the history of present illness.   Otherwise, review of systems are positive for GERD since CABG, joint pains.   All other systems are reviewed and negative.    PHYSICAL EXAM: VS:  BP 140/60 (BP Location: Left Arm, Patient Position: Sitting, Cuff Size: Normal)   Pulse 69   Ht 5\' 7"  (1.702 m)   Wt 187 lb (84.8 kg)   BMI 29.29 kg/m  , BMI Body mass index is 29.29 kg/m. GEN: Well nourished, well developed, in no acute distress HEENT: normal Neck: no JVD, carotid bruits, or masses Cardiac: RRR; no murmurs, rubs, or gallops,no edema  Respiratory:  clear to auscultation bilaterally, normal work of breathing GI: soft, nontender, nondistended, + BS MS: no deformity or atrophy Skin: warm and dry, no rash Neuro:  Strength and sensation are intact Psych: euthymic mood, full affect   EKG:   The ekg ordered today demonstrates NSR,nonspecific ST changes, no change from prior ECG in 2022.   Recent Labs: No results found for requested labs within last 365 days.   Lipid Panel    Component Value Date/Time   CHOL 184 11/22/2020 1006   TRIG 368 (H) 11/22/2020 1006   HDL 28 (L) 11/22/2020 1006   CHOLHDL 6.6 (H) 11/22/2020 1006   CHOLHDL 4.9 09/02/2017 0351   VLDL 28 09/02/2017 0351   LDLCALC 94 11/22/2020 1006     Other studies Reviewed: Additional studies/ records that were reviewed today with results demonstrating: labs reviewed.   ASSESSMENT AND PLAN:  CAD: No angina on medical therapy.  Continue aggressive secondary prevention.  No bleeding problems. Hypertension: The current medical regimen is effective;  continue present plan and medications. DM2: A1c 6.7 in March 2023.   Hyperlipidemia: Total cholesterol 139 HDL 27 LDL 81 triglyceride 181 in  March 2023.   Chronic diastolic heart failure: Appears euvolemic.  Whole food, plant-based diet.  Avoid excessive salt.  Avoid processed foods.   Current medicines are reviewed at length with the patient today.  The patient concerns regarding his medicines were addressed.  The following changes have been made:  No change  Labs/ tests ordered today include:  No orders of the defined types were placed in this encounter.   Recommend 150 minutes/week of aerobic exercise Low fat, low carb, high fiber diet recommended  Disposition:   FU in 1 year   Signed, Lance Muss, MD  12/20/2021 11:49 AM    [facility] [address] [place], [place], [place]  [address] Phone: [phone]; Fax: [phone]

## 2021-12-20 ENCOUNTER — Encounter: Payer: Self-pay | Admitting: Interventional Cardiology

## 2021-12-20 ENCOUNTER — Ambulatory Visit: Payer: BC Managed Care – PPO | Admitting: Interventional Cardiology

## 2021-12-20 VITALS — BP 140/60 | HR 69 | Ht 67.0 in | Wt 187.0 lb

## 2021-12-20 DIAGNOSIS — I10 Essential (primary) hypertension: Secondary | ICD-10-CM | POA: Diagnosis not present

## 2021-12-20 DIAGNOSIS — I5032 Chronic diastolic (congestive) heart failure: Secondary | ICD-10-CM

## 2021-12-20 DIAGNOSIS — I25810 Atherosclerosis of coronary artery bypass graft(s) without angina pectoris: Secondary | ICD-10-CM | POA: Diagnosis not present

## 2021-12-20 DIAGNOSIS — E1159 Type 2 diabetes mellitus with other circulatory complications: Secondary | ICD-10-CM

## 2021-12-20 DIAGNOSIS — E782 Mixed hyperlipidemia: Secondary | ICD-10-CM

## 2021-12-20 NOTE — Patient Instructions (Signed)

## 2021-12-25 ENCOUNTER — Other Ambulatory Visit: Payer: Self-pay | Admitting: Interventional Cardiology

## 2022-01-26 ENCOUNTER — Other Ambulatory Visit: Payer: Self-pay | Admitting: Physician Assistant

## 2022-01-26 ENCOUNTER — Telehealth: Payer: Self-pay

## 2022-01-26 NOTE — Telephone Encounter (Signed)
**Note De-Identified Nikie Cid Obfuscation** Loretha Stapler (Key: [id number]) Vascepa 1GM capsules   Form [employer] Electronic PA Form [phone] NCPDP)  Determination Favorable Prior authorization for Vascepa has been approved.  Message from plan: Your PA request has been approved. Additional information will be provided in the approval communication.  I have notified [facility] [address], [place] - [address] (Ph: [phone]) of this approval.

## 2022-01-27 ENCOUNTER — Other Ambulatory Visit: Payer: Self-pay | Admitting: Interventional Cardiology

## 2022-01-29 ENCOUNTER — Other Ambulatory Visit: Payer: Self-pay

## 2022-01-29 MED ORDER — FENOFIBRATE 48 MG PO TABS: 48.0000 mg | ORAL_TABLET | Freq: Every day | ORAL | 3 refills | Status: DC

## 2022-03-01 ENCOUNTER — Other Ambulatory Visit: Payer: Self-pay | Admitting: Interventional Cardiology

## 2022-03-01 DIAGNOSIS — I10 Essential (primary) hypertension: Secondary | ICD-10-CM

## 2022-03-08 ENCOUNTER — Other Ambulatory Visit: Payer: Self-pay | Admitting: Interventional Cardiology

## 2022-03-17 ENCOUNTER — Other Ambulatory Visit: Payer: Self-pay | Admitting: Interventional Cardiology

## 2022-03-19 ENCOUNTER — Other Ambulatory Visit: Payer: Self-pay

## 2022-03-19 MED ORDER — VASCEPA 1 G PO CAPS: 2.0000 g | ORAL_CAPSULE | Freq: Two times a day (BID) | ORAL | 9 refills | Status: DC

## 2022-11-29 ENCOUNTER — Other Ambulatory Visit: Payer: Self-pay | Admitting: Interventional Cardiology

## 2022-11-29 DIAGNOSIS — I10 Essential (primary) hypertension: Secondary | ICD-10-CM

## 2022-12-16 ENCOUNTER — Other Ambulatory Visit: Payer: Self-pay | Admitting: Interventional Cardiology

## 2022-12-29 ENCOUNTER — Other Ambulatory Visit: Payer: Self-pay | Admitting: Interventional Cardiology

## 2022-12-29 DIAGNOSIS — I10 Essential (primary) hypertension: Secondary | ICD-10-CM

## 2023-01-02 ENCOUNTER — Other Ambulatory Visit: Payer: Self-pay | Admitting: Interventional Cardiology

## 2023-01-26 ENCOUNTER — Other Ambulatory Visit: Payer: Self-pay | Admitting: Interventional Cardiology

## 2023-01-26 DIAGNOSIS — I10 Essential (primary) hypertension: Secondary | ICD-10-CM

## 2023-02-16 ENCOUNTER — Other Ambulatory Visit: Payer: Self-pay | Admitting: Interventional Cardiology

## 2023-02-17 ENCOUNTER — Other Ambulatory Visit: Payer: Self-pay | Admitting: Interventional Cardiology

## 2023-02-18 ENCOUNTER — Telehealth: Payer: Self-pay | Admitting: Interventional Cardiology

## 2023-02-18 NOTE — Telephone Encounter (Signed)
Patient states he is returning a call to [facility].

## 2023-02-18 NOTE — Telephone Encounter (Signed)
Needs annual apt. Called pt and LVM for him to call back

## 2023-02-19 ENCOUNTER — Telehealth: Payer: Self-pay

## 2023-02-19 ENCOUNTER — Other Ambulatory Visit (HOSPITAL_COMMUNITY): Payer: Self-pay

## 2023-02-19 NOTE — Telephone Encounter (Signed)
Spoke with patient. Advised I would refill his ozempic but he needed apt with MD. Curtis Little he lives in [place] can he see someone there. Advised for him to call the [place] office and schedule apt. He is Dr. Eldridge Dace pt and he is leaving so he will need a new MD anyway.

## 2023-02-19 NOTE — Telephone Encounter (Signed)
Pharmacy Patient Advocate Encounter   Received notification from CoverMyMeds that prior authorization for [facility] is required/requested.   Insurance verification completed.   The patient is insured through [facility] .   Per test claim: PA required; PA submitted to [facility] via CoverMyMeds Key/confirmation #/EOC [facility] Status is pending

## 2023-02-20 ENCOUNTER — Other Ambulatory Visit (HOSPITAL_COMMUNITY): Payer: Self-pay

## 2023-02-20 NOTE — Telephone Encounter (Signed)
Pharmacy Patient Advocate Encounter  Received notification from [facility] that Prior Authorization for REPATHA has been APPROVED from 02/19/23 to 02/18/26

## 2023-03-01 ENCOUNTER — Other Ambulatory Visit: Payer: Self-pay | Admitting: Interventional Cardiology

## 2023-03-01 DIAGNOSIS — I10 Essential (primary) hypertension: Secondary | ICD-10-CM

## 2023-03-13 ENCOUNTER — Other Ambulatory Visit: Payer: Self-pay | Admitting: Interventional Cardiology

## 2023-03-13 DIAGNOSIS — I10 Essential (primary) hypertension: Secondary | ICD-10-CM

## 2023-03-22 ENCOUNTER — Other Ambulatory Visit: Payer: Self-pay | Admitting: Interventional Cardiology

## 2023-03-22 DIAGNOSIS — I10 Essential (primary) hypertension: Secondary | ICD-10-CM

## 2023-03-25 NOTE — Telephone Encounter (Signed)
Pt has an overdue follow up appt on 04/16/23 with Dr. Brett Canales.

## 2023-04-16 ENCOUNTER — Ambulatory Visit: Payer: BC Managed Care – PPO | Attending: Internal Medicine | Admitting: Internal Medicine

## 2023-04-16 ENCOUNTER — Encounter: Payer: Self-pay | Admitting: Internal Medicine

## 2023-04-16 VITALS — BP 148/70 | HR 68 | Ht 67.0 in | Wt 188.8 lb

## 2023-04-16 DIAGNOSIS — E782 Mixed hyperlipidemia: Secondary | ICD-10-CM | POA: Diagnosis not present

## 2023-04-16 DIAGNOSIS — I5032 Chronic diastolic (congestive) heart failure: Secondary | ICD-10-CM | POA: Insufficient documentation

## 2023-04-16 DIAGNOSIS — I25810 Atherosclerosis of coronary artery bypass graft(s) without angina pectoris: Secondary | ICD-10-CM | POA: Diagnosis not present

## 2023-04-16 DIAGNOSIS — I10 Essential (primary) hypertension: Secondary | ICD-10-CM | POA: Insufficient documentation

## 2023-04-16 MED ORDER — NITROGLYCERIN 0.4 MG SL SUBL: 0.4000 mg | SUBLINGUAL_TABLET | SUBLINGUAL | 2 refills | Status: DC | PRN

## 2023-04-16 NOTE — Progress Notes (Signed)
Cardiology Office Note  Date: 04/16/2023   ID: Curtis Little, Curtis Little 1950-07-09, MRN [id number]  PCP:  [facility]  Cardiologist:  Marjo Bicker, MD Electrophysiologist:  None   History of Present Illness: Curtis Little is a 72 y.o. male known to have CAD s/p LCx PCI and RCA PCI in 2010, four-vessel CABG in 2019, HTN, DM 2, HLD, chronic diastolic heart failure is here for follow-up visit.  Patient is here for follow-up visit with me. No interval ER visits or hospitalizations. Patient denied any rest or exertional chest discomfort, tightness, heaviness or pressure, rest or exertional dyspnea, palpitations, light-headedness, syncope and LE swelling. Compliant with medications and no side-effects. No bleeding complications. Denied smoking cigarettes.   Past Medical History:  Diagnosis Date   Chronic diastolic CHF (congestive heart failure) (HCC)    Coronary artery disease    a. NSTEMI in 2010 s/p BMS to mLCx and atherectomy/stent to RCA. b. NSTEMI s/p CABG 09/2017 - LIMA to LAD, SVG to DIAGONAL, SVG to OM, SVG to RCA.   Diabetes mellitus (HCC)    Hyperlipidemia    Hypertension    Hypertensive heart disease    LVH (left ventricular hypertrophy)     Past Surgical History:  Procedure Laterality Date   CORONARY ARTERY BYPASS GRAFT N/A 09/05/2017   Procedure: CORONARY ARTERY BYPASS GRAFTING (CABG) x four , using left internal mammary artery and right leg greater saphenous vein harvested endoscopically;  Surgeon: Kerin Perna, MD;  Location: [facility] OR;  Service: Open Heart Surgery;  Laterality: N/A;   LEFT HEART CATH AND CORONARY ANGIOGRAPHY N/A 09/01/2017   Procedure: LEFT HEART CATH AND CORONARY ANGIOGRAPHY;  Surgeon: Corky Crafts, MD;  Location: [facility] INVASIVE CV LAB;  Service: Cardiovascular;  Laterality: N/A;   TEE WITHOUT CARDIOVERSION N/A 09/05/2017   Procedure: TRANSESOPHAGEAL ECHOCARDIOGRAM (TEE);  Surgeon: Donata Clay, Theron Arista, MD;  Location: [facility] OR;  Service:  Open Heart Surgery;  Laterality: N/A;    Current Outpatient Medications  Medication Sig Dispense Refill   aspirin EC 81 MG tablet Take 81 mg by mouth daily.     atenolol (TENORMIN) 50 MG tablet Take 1 tablet by mouth once daily 30 tablet 0   atorvastatin (LIPITOR) 80 MG tablet Take 1 tablet by mouth once daily 30 tablet 0   ezetimibe (ZETIA) 10 MG tablet TAKE 1 TABLET BY MOUTH ONCE DAILY . APPOINTMENT REQUIRED FOR FUTURE REFILLS 30 tablet 0   fenofibrate (TRICOR) 48 MG tablet TAKE 1 TABLET BY MOUTH ONCE DAILY **MUST MAKE APPOINTMENT FOR FURTHER REFILLS** 30 tablet 0   furosemide (LASIX) 40 MG tablet Take 1 tablet (40 mg total) by mouth daily. Please make overdue appt with Dr. Eldridge Dace before anymore refills. Thank you 1st attempt 30 tablet 0   losartan (COZAAR) 100 MG tablet Take 100 mg by mouth daily.     metFORMIN (GLUCOPHAGE-XR) 500 MG 24 hr tablet Take 1,000 mg by mouth daily.     nitroGLYCERIN (NITROSTAT) 0.4 MG SL tablet Place 1 tablet (0.4 mg total) under the tongue every 5 (five) minutes x 3 doses as needed for chest pain (if no relief after 3rd dose proceed to ED or call 911). 25 tablet 2   Semaglutide, 1 MG/DOSE, (OZEMPIC, 1 MG/DOSE,) 4 MG/3ML SOPN INJECT 1MG  SUBCUTANEOUSLY ONCE A WEEK 3 mL 2   traZODone (DESYREL) 50 MG tablet Take 1 tablet by mouth at bedtime.     TRESIBA FLEXTOUCH 200 UNIT/ML SOPN Inject 70 Units as  directed daily.  6   VASCEPA 1 g capsule Take 2 capsules by mouth twice daily 120 capsule 0   No current facility-administered medications for this visit.   Allergies:  Patient has no known allergies.   Social History: The patient  reports that he quit smoking about 24 years ago. His smoking use included cigarettes. He has never used smokeless tobacco. He reports current alcohol use of about 1.0 standard drink of alcohol per week. He reports that he does not use drugs.   Family History: The patient's family history includes Colon cancer in his mother; Hypertension in  his daughter; Other in his sister; Stroke in his father.   ROS:  Please see the history of present illness. Otherwise, complete review of systems is positive for none  All other systems are reviewed and negative.   Physical Exam: VS:  BP (!) 148/70 (BP Location: Right Arm, Cuff Size: Normal)   Pulse 68   Ht 5\' 7"  (1.702 m)   Wt 188 lb 12.8 oz (85.6 kg)   SpO2 96%   BMI 29.57 kg/m , BMI Body mass index is 29.57 kg/m.  Wt Readings from Last 3 Encounters:  04/16/23 188 lb 12.8 oz (85.6 kg)  12/20/21 187 lb (84.8 kg)  11/22/20 200 lb 6.4 oz (90.9 kg)    General: Patient appears comfortable at rest. HEENT: Conjunctiva and lids normal, oropharynx clear with moist mucosa. Neck: Supple, no elevated JVP or carotid bruits, no thyromegaly. Lungs: Clear to auscultation, nonlabored breathing at rest. Cardiac: Regular rate and rhythm, no S3 or significant systolic murmur, no pericardial rub. Abdomen: Soft, nontender, no hepatomegaly, bowel sounds present, no guarding or rebound. Extremities: No pitting edema, distal pulses 2+. Skin: Warm and dry. Musculoskeletal: No kyphosis. Neuropsychiatric: Alert and oriented x3, affect grossly appropriate.  Recent Labwork: No results found for requested labs within last 365 days.     Component Value Date/Time   CHOL 184 11/22/2020 1006   TRIG 368 (H) 11/22/2020 1006   HDL 28 (L) 11/22/2020 1006   CHOLHDL 6.6 (H) 11/22/2020 1006   CHOLHDL 4.9 09/02/2017 0351   VLDL 28 09/02/2017 0351   LDLCALC 94 11/22/2020 1006     Assessment and Plan:  CAD manifested by NSTEMI in 2010 s/p BMS to LCx and atherectomy/PCI to RCA, NSTEMI in 2019 s/p 4V CABG (LIMA to LAD, SVG to diagonal, SVG to OM, SVG to RCA), angina free HLD, near goal HTN, controlled Chronic diastolic heart failure, compensated  -Continue aspirin 81 mg once daily, atorvastatin 80 mg nightly, Zetia 10 mg once daily, fenofibrate 48 mg once daily, Vascepa 2 g twice daily.  SL NTG 0.4 mg as  needed, precautions for chest pain.  I reviewed the lipid panel from 2023 that showed LDL 81, TG 181.  Will need to update lipid panel, obtain lipid panel.  Goal LDL less than 70.  Echocardiogram in 2019 showed normal LVEF, moderate LVH and no valvular heart disease.  Asymptomatic, no need to repeat echo.  Compensated, continue p.o. Lasix 40 mg once daily. -Otherwise, continue current antihypertensives, atenolol 50 mg once daily.    I spent a total duration 30 minutes reviewing prior notes, EKG, labs, face-to-face discussion of his medical condition, pathophysiology, evaluation, management, ordering test and documenting the findings in the note.   Medication Adjustments/Labs and Tests Ordered: Current medicines are reviewed at length with the patient today.  Concerns regarding medicines are outlined above.    Disposition:  Follow up  1 year  Signed Latoshia Monrroy Verne Spurr, MD, 04/16/2023 2:05 PM    [facility] at [facility] [address] [place], [place], [place] [address]

## 2023-04-16 NOTE — Patient Instructions (Signed)
Medication Instructions:  Your physician has recommended you make the following change in your medication:  Nitroglycerin 0.4 mg Dissolve one under tongue for chest pain every 5 minutes up to 3 doses. If no relief, proceed to ED.  Continue taking all other medications as prescribed  Labwork: Lipid Panel to be completed at [facility]  Testing/Procedures: None  Follow-Up: Your physician recommends that you schedule a follow-up appointment in: 1 year. You will receive a reminder call in about 8 months reminding you to schedule your appointment. If you don't receive this call, please contact our office.   Any Other Special Instructions Will Be Listed Below (If Applicable). Thank you for choosing [facility]!      If you need a refill on your cardiac medications before your next appointment, please call your pharmacy.

## 2023-04-22 ENCOUNTER — Telehealth: Payer: Self-pay

## 2023-04-22 NOTE — Telephone Encounter (Signed)
-----   Message from Vishnu P Mallipeddi sent at 04/19/2023  1:05 PM EST ----- Triglycerides significantly improved, LDL near goal.  Continue current medications.

## 2023-04-22 NOTE — Telephone Encounter (Signed)
Patient informed and verbalized understanding of plan. 

## 2023-04-26 ENCOUNTER — Other Ambulatory Visit: Payer: Self-pay | Admitting: Internal Medicine

## 2023-04-26 DIAGNOSIS — I10 Essential (primary) hypertension: Secondary | ICD-10-CM

## 2023-05-09 ENCOUNTER — Other Ambulatory Visit: Payer: Self-pay | Admitting: Interventional Cardiology

## 2023-05-09 NOTE — Telephone Encounter (Signed)
Call to assess BG control and insulin dose. Plan is to titrate up  Ozempic and lower insulin dose. N/A LVM

## 2023-05-15 ENCOUNTER — Other Ambulatory Visit: Payer: Self-pay | Admitting: Internal Medicine

## 2023-06-03 ENCOUNTER — Telehealth: Payer: Self-pay | Admitting: Internal Medicine

## 2023-06-03 DIAGNOSIS — I10 Essential (primary) hypertension: Secondary | ICD-10-CM

## 2023-06-03 MED ORDER — LOSARTAN POTASSIUM 100 MG PO TABS: 100.0000 mg | ORAL_TABLET | Freq: Every day | ORAL | 1 refills | Status: DC

## 2023-06-03 MED ORDER — EZETIMIBE 10 MG PO TABS: 10.0000 mg | ORAL_TABLET | Freq: Every day | ORAL | 1 refills | Status: DC

## 2023-06-03 MED ORDER — ATORVASTATIN CALCIUM 80 MG PO TABS: ORAL_TABLET | ORAL | 1 refills | Status: DC

## 2023-06-03 MED ORDER — ATENOLOL 50 MG PO TABS
50.0000 mg | ORAL_TABLET | Freq: Every day | ORAL | 1 refills | Status: DC
Start: 2023-06-03 — End: 2023-12-20

## 2023-06-03 NOTE — Telephone Encounter (Signed)
*  STAT* If patient is at the pharmacy, call can be transferred to refill team.   1. Which medications need to be refilled? (please list name of each medication and dose if known)   atenolol (TENORMIN) 50 MG table  atorvastatin (LIPITOR) 80 MG tablet  ezetimibe (ZETIA) 10 MG tablet  losartan (COZAAR) 100 MG tablet  2. Would you like to learn more about the convenience, safety, & potential cost savings by using the [facility]? No    3. Are you open to using the [facility] (Type [facility]. ) No   4. Which pharmacy/location (including street and city if local pharmacy) is medication to be sent to?[facility] - [place], [place] - [address]    5. Do they need a 30 day or 90 day supply? 90 day

## 2023-06-03 NOTE — Telephone Encounter (Signed)
All refills sent in per patient request

## 2023-06-07 ENCOUNTER — Telehealth: Payer: Self-pay | Admitting: Pharmacy Technician

## 2023-06-07 ENCOUNTER — Telehealth: Payer: Self-pay | Admitting: Internal Medicine

## 2023-06-07 ENCOUNTER — Other Ambulatory Visit (HOSPITAL_COMMUNITY): Payer: Self-pay

## 2023-06-07 NOTE — Telephone Encounter (Signed)
 Patient can not afford this high deductible and his new plan from [employer] will be starting from Feb 2025. Patient will share new insurance info when he has it so we can  assess Ozempic coverage.

## 2023-06-07 NOTE — Telephone Encounter (Signed)
 Pharmacy Patient Advocate Encounter   Received notification from Pt Calls Messages that prior authorization for ozempic  is required/requested.   Insurance verification completed.   The patient is insured through [employer] .   Per test claim: The current 06/07/23 day co-pay is, $416.94 one month  (DEDUCTIBLE).  No PA needed at this time. This test claim was processed through [facility]- copay amounts may vary at other pharmacies due to pharmacy/plan contracts, or as the patient moves through the different stages of their insurance plan.

## 2023-06-07 NOTE — Telephone Encounter (Signed)
 Called pt to update that doesn't need a PA, he had deductible. Will need if he needs an Rx sent to new pharmacy.  LVM for him to call back.

## 2023-06-07 NOTE — Telephone Encounter (Signed)
   Pt c/o medication issue:  1. Name of Medication:    Ozempic  1mg /dose  2. How are you currently taking this medication (dosage and times per day)?   1mg  per week  3. Are you having a reaction (difficulty breathing--STAT)?    no  4. What is your medication issue?   Pt has switched to [facility] and he said they are requiring an authorization be done. He took his last dose Thursday 06/06/2023.   Pharmacy#: [phone]

## 2023-09-18 ENCOUNTER — Other Ambulatory Visit (HOSPITAL_COMMUNITY): Payer: Self-pay

## 2023-09-18 ENCOUNTER — Other Ambulatory Visit: Payer: Self-pay | Admitting: Internal Medicine

## 2023-09-18 ENCOUNTER — Telehealth: Payer: Self-pay | Admitting: Pharmacy Technician

## 2023-09-18 NOTE — Telephone Encounter (Signed)
 Pharmacy Patient Advocate Encounter   Received notification from CoverMyMeds that prior authorization for ozempic is required/requested.   Insurance verification completed.   The patient is insured through [employer] .   Per test claim: PA required; PA submitted to above mentioned insurance via CoverMyMeds Key/confirmation #/EOC [facility] Status is pending

## 2023-09-18 NOTE — Telephone Encounter (Signed)
 Pharmacy Patient Advocate Encounter  Received notification from [employer] that Prior Authorization for ozempic has been APPROVED from 08/19/23 to 09/17/24. Spoke to pharmacy to process.Copay is $12.15.    PA #/Case ID/Reference #: [id number]

## 2023-09-24 ENCOUNTER — Telehealth: Payer: Self-pay | Admitting: *Deleted

## 2023-09-24 NOTE — Telephone Encounter (Signed)
 Called pt to verify pharmacy and if he is able to fill Vascepa . No answer. Left msg to call back.

## 2023-09-24 NOTE — Telephone Encounter (Signed)
-----   Message from Vishnu P Mallipeddi sent at 09/20/2023  4:53 PM EDT ----- Regarding: Vascepa  He has been on Vascepa  for a few years. I would switch to Lovaza 2g BID if his insurance does not cover for Vascepa  anymore.

## 2023-10-01 NOTE — Telephone Encounter (Signed)
 Called pt's daughter d/t pt's number being the wrong number. No answer. Left msg to call back.

## 2023-10-02 MED ORDER — OMEGA-3-ACID ETHYL ESTERS 1 G PO CAPS: 2.0000 g | ORAL_CAPSULE | Freq: Two times a day (BID) | ORAL | 3 refills | Status: AC

## 2023-10-02 NOTE — Telephone Encounter (Signed)
 Spoke with pt and he is unaware that the insurance no longer wants to pay for Vascepa . Will call the pharmacy to verify.

## 2023-10-02 NOTE — Addendum Note (Signed)
 Addended by: Lucella Pommier G on: 10/02/2023 09:55 AM   Modules accepted: Orders

## 2023-11-15 ENCOUNTER — Other Ambulatory Visit: Payer: Self-pay | Admitting: Internal Medicine

## 2023-12-13 ENCOUNTER — Other Ambulatory Visit: Payer: Self-pay

## 2023-12-13 MED ORDER — LOSARTAN POTASSIUM 100 MG PO TABS: 100.0000 mg | ORAL_TABLET | Freq: Every day | ORAL | 1 refills | Status: DC

## 2023-12-18 ENCOUNTER — Other Ambulatory Visit: Payer: Self-pay | Admitting: Internal Medicine

## 2023-12-18 DIAGNOSIS — I10 Essential (primary) hypertension: Secondary | ICD-10-CM

## 2024-02-12 DIAGNOSIS — Z6828 Body mass index (BMI) 28.0-28.9, adult: Secondary | ICD-10-CM | POA: Diagnosis not present

## 2024-02-12 DIAGNOSIS — N471 Phimosis: Secondary | ICD-10-CM | POA: Diagnosis not present

## 2024-02-12 DIAGNOSIS — N481 Balanitis: Secondary | ICD-10-CM | POA: Diagnosis not present

## 2024-02-14 DIAGNOSIS — Z6828 Body mass index (BMI) 28.0-28.9, adult: Secondary | ICD-10-CM | POA: Diagnosis not present

## 2024-02-14 DIAGNOSIS — N471 Phimosis: Secondary | ICD-10-CM | POA: Diagnosis not present

## 2024-02-14 DIAGNOSIS — N481 Balanitis: Secondary | ICD-10-CM | POA: Diagnosis not present

## 2024-03-10 ENCOUNTER — Other Ambulatory Visit: Payer: Self-pay | Admitting: Internal Medicine

## 2024-04-29 ENCOUNTER — Other Ambulatory Visit: Payer: Self-pay | Admitting: Internal Medicine

## 2024-04-29 DIAGNOSIS — I10 Essential (primary) hypertension: Secondary | ICD-10-CM

## 2024-05-04 ENCOUNTER — Other Ambulatory Visit: Payer: Self-pay | Admitting: Internal Medicine

## 2024-05-17 ENCOUNTER — Other Ambulatory Visit: Payer: Self-pay | Admitting: Internal Medicine

## 2024-05-20 NOTE — Telephone Encounter (Signed)
 Patient made aware he needs apt.

## 2024-06-10 ENCOUNTER — Other Ambulatory Visit: Payer: Self-pay | Admitting: Internal Medicine

## 2024-06-10 DIAGNOSIS — I10 Essential (primary) hypertension: Secondary | ICD-10-CM

## 2024-06-20 ENCOUNTER — Other Ambulatory Visit: Payer: Self-pay | Admitting: Internal Medicine

## 2024-06-28 ENCOUNTER — Other Ambulatory Visit: Payer: Self-pay | Admitting: Internal Medicine

## 2024-07-07 ENCOUNTER — Other Ambulatory Visit: Payer: Self-pay | Admitting: Internal Medicine

## 2024-07-07 DIAGNOSIS — I10 Essential (primary) hypertension: Secondary | ICD-10-CM

## 2024-07-08 ENCOUNTER — Ambulatory Visit: Payer: Medicare (Managed Care) | Admitting: Nurse Practitioner

## 2024-07-08 ENCOUNTER — Encounter: Payer: Self-pay | Admitting: Nurse Practitioner

## 2024-07-08 VITALS — BP 144/80 | HR 66 | Ht 67.0 in | Wt 187.4 lb

## 2024-07-08 DIAGNOSIS — I25810 Atherosclerosis of coronary artery bypass graft(s) without angina pectoris: Secondary | ICD-10-CM

## 2024-07-08 DIAGNOSIS — R9431 Abnormal electrocardiogram [ECG] [EKG]: Secondary | ICD-10-CM

## 2024-07-08 DIAGNOSIS — I10 Essential (primary) hypertension: Secondary | ICD-10-CM

## 2024-07-08 DIAGNOSIS — I5032 Chronic diastolic (congestive) heart failure: Secondary | ICD-10-CM

## 2024-07-08 DIAGNOSIS — I517 Cardiomegaly: Secondary | ICD-10-CM

## 2024-07-08 DIAGNOSIS — E785 Hyperlipidemia, unspecified: Secondary | ICD-10-CM

## 2024-07-08 DIAGNOSIS — I2510 Atherosclerotic heart disease of native coronary artery without angina pectoris: Secondary | ICD-10-CM

## 2024-07-08 MED ORDER — NITROGLYCERIN 0.4 MG SL SUBL: 0.4000 mg | SUBLINGUAL_TABLET | SUBLINGUAL | 2 refills | Status: AC | PRN

## 2024-07-08 NOTE — Progress Notes (Unsigned)
 " Cardiology Office Note   Date:  07/08/2024 ID:  Curtis Little, Curtis Little 06/23/1950, MRN [id number] PCP: [facility], [facility]  [facility] Providers Cardiologist:  Diannah SHAUNNA Maywood, MD     History of Present Illness Curtis Little is a 74 y.o. male with a PMH of CAD, s/p CABG x 4 in 2019, hx of PCI to LCX and PCI to RCA in 2019, Chronic diastolic CHF, HTN, HLD, T2DM, who presents today for 1 year follow-up.   Last seen by Dr. Mallipeddi on 04/16/2023. Was doing well at the time.   Today he is here for overdue 1 year follow-up. He states he is doing well. BP well controlled at home. Denies any chest pain, shortness of breath, palpitations, syncope, presyncope, dizziness, orthopnea, PND, swelling or significant weight changes, acute bleeding, or claudication.  ROS: Negative. See HPI.   Studies Reviewed  EKG: EKG Interpretation Date/Time:  Wednesday July 08 2024 10:57:12 EST Ventricular Rate:  66 PR Interval:  166 QRS Duration:  106 QT Interval:  442 QTC Calculation: 463 R Axis:   3  Text Interpretation: Normal sinus rhythm Left ventricular hypertrophy with repolarization abnormality ( R in aVL ) Inferior infarct (cited on or before 05-Sep-2017) When compared with ECG of 16-Apr-2023 13:22, T wave inversion more evident in Lateral leads Confirmed by Miriam Norris [phone]) on 07/08/2024 11:10:46 AM   TEE 09/2017:   Aortic valve: The valve is trileaflet. No stenosis. No regurgitation.   Mitral valve: Mild regurgitation, central jet.   Right ventricle: Normal cavity size, wall thickness and ejection  fraction.   Tricuspid valve: Trace regurgitation, regurgitation jet is central.   Left ventricle: Normal LV size, LVEF 50-55%, no significant wall motion  abnormalities   Left Atrial Appendage: normal velocities, no thrombus present    Post Bypass:   Tricuspid, Pulmonic, Mitral and Aortic valve unchanged. Inferoseptal and  anterolateral segments of LV mildly  hypokinetic but improving. LV CO 5.4,  4.9 with minimal pressor support. No aortic dissection present after  aortic cannula removed.   Vascular ultrasound pre-CABG 09/2017:  Final Interpretation:  Right Carotid: Velocities in the right ICA are consistent with a 1-39%  stenosis.   Left Carotid: Velocities in the left ICA are consistent with a 1-39%  stenosis.  Vertebrals: Bilateral vertebral arteries demonstrate antegrade flow.  Subclavians: Normal flow hemodynamics were seen in bilateral subclavian               arteries.   Right ABI: Resting right ankle-brachial index is within normal range. No  evidence of significant right lower extremity arterial disease.  Left ABI: Resting left ankle-brachial index is within normal range. No  evidence of significant left lower extremity arterial disease.  Right Upper Extremity: Doppler waveforms remain within normal limits with  radial compression. Doppler waveforms remain within normal limits with  ulnar compression.  Left Upper Extremity: Doppler waveforms remain within normal limits with  radial compression. Doppler waveforms remain within normal limits with  ulnar compression.    Echo 09/2017: Study Conclusions   - Left ventricle: Wall thickness was increased in a pattern of    moderate LVH. Systolic function was normal. The estimated    ejection fraction was in the range of 50% to 55%. Wall motion was    normal; there were no regional wall motion abnormalities. Doppler    parameters are consistent with abnormal left ventricular    relaxation (grade 1 diastolic dysfunction).  LHC 08/2017:  Ost Cx to  Mid Cx lesion is 100% stenosed. There are left to left collaterals. Mid Cx to Dist Cx lesion is 25% stenosed. Ost LM to Mid LM lesion is 50% stenosed. Ost LAD to Prox LAD lesion is 75% stenosed. Ost 1st Diag to 1st Diag lesion is 75% stenosed. Prox LAD to Mid LAD lesion is 60% stenosed. Mid RCA lesion is 25% instent restenosis in the  proximal portion of the Taxus stent. Prox RCA to Mid RCA lesion is 90% stenosed. Dist RCA lesion is 75% stenosed. The left ventricular ejection fraction is 35-45% by visual estimate. There is mild to moderate left ventricular systolic dysfunction. LV end diastolic pressure is mildly elevated. There is no aortic valve stenosis.   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   Severe three vessel CAD.  Plan for cardiac surgery referral.    Medical therapy for LV dysfunction.      Physical Exam VS:  BP 121/80   Pulse 66   Ht 5' 7 (1.702 m)   Wt 187 lb 6.4 oz (85 kg)   SpO2 100%   BMI 29.35 kg/m        Wt Readings from Last 3 Encounters:  07/08/24 187 lb 6.4 oz (85 kg)  04/16/23 188 lb 12.8 oz (85.6 kg)  12/20/21 187 lb (84.8 kg)    GEN: Well nourished, well developed in no acute distress NECK: No JVD; No carotid bruits CARDIAC: S1/S2, RRR, no murmurs, rubs, gallops RESPIRATORY:  Clear to auscultation without rales, wheezing or rhonchi  ABDOMEN: Soft, non-tender, non-distended EXTREMITIES:  No edema; No deformity   ASSESSMENT AND PLAN  CAD, s/p CABG x 4 Stable with no anginal symptoms. No indication for ischemic evaluation.  Continue aspirin , atorvastatin , atenolol , Zetia , fenofibrate , losartan , and nitroglycerin  as needed.  Will refill NTG. Heart healthy diet and regular cardiovascular exercise encouraged.   Chronic diastolic CHF, LVH, nonspecific abnormal EKG finding Stage C, NYHA class I symptoms.  EKG today shows findings of LVH with what appears to be repolarization abnormalities in several leads/nonspecific ST/T wave abnormality.  EF 50 to 55% in 2019.  Will update echocardiogram at this time. No medication changes at this time. Continue current medication regimen.   HTN BP on recheck stable. Discussed to monitor BP at home at least 2 hours after medications and sitting for 5-10 minutes. No medication changes at this time. Heart healthy  diet and regular cardiovascular exercise encouraged.   HLD   No recent labs on file. He defers this to his PCP and will fax us  results when these are available. Continue current medication regimen.  Heart healthy diet and regular cardiovascular exercise encouraged.    Dispo: Follow-up with MD/APP in 6 months or sooner if any changes.  Signed, Almarie Crate, NP   "

## 2024-07-08 NOTE — Patient Instructions (Signed)

## 2024-07-23 ENCOUNTER — Ambulatory Visit: Payer: Medicare (Managed Care) | Admitting: Nurse Practitioner

## 2024-07-27 ENCOUNTER — Ambulatory Visit: Payer: Medicare (Managed Care)
